# Patient Record
Sex: Female | Born: 1950 | Race: White | Hispanic: No | Marital: Married | Smoking: Former smoker
Health system: Southern US, Community
[De-identification: ages and names within clinical notes are randomized; demographics above are authoritative.]

## PROBLEM LIST (undated history)

## (undated) DIAGNOSIS — K5790 Diverticulosis of intestine, part unspecified, without perforation or abscess without bleeding: Secondary | ICD-10-CM

## (undated) DIAGNOSIS — I4891 Unspecified atrial fibrillation: Secondary | ICD-10-CM

## (undated) DIAGNOSIS — Z01419 Encounter for gynecological examination (general) (routine) without abnormal findings: Secondary | ICD-10-CM

## (undated) DIAGNOSIS — I499 Cardiac arrhythmia, unspecified: Secondary | ICD-10-CM

## (undated) DIAGNOSIS — R351 Nocturia: Secondary | ICD-10-CM

## (undated) DIAGNOSIS — M8580 Other specified disorders of bone density and structure, unspecified site: Secondary | ICD-10-CM

## (undated) DIAGNOSIS — F32A Depression, unspecified: Secondary | ICD-10-CM

## (undated) DIAGNOSIS — F319 Bipolar disorder, unspecified: Secondary | ICD-10-CM

## (undated) DIAGNOSIS — M545 Other chronic pain: Secondary | ICD-10-CM

## (undated) DIAGNOSIS — F419 Anxiety disorder, unspecified: Secondary | ICD-10-CM

## (undated) DIAGNOSIS — Z9289 Personal history of other medical treatment: Secondary | ICD-10-CM

## (undated) DIAGNOSIS — C50919 Malignant neoplasm of unspecified site of unspecified female breast: Secondary | ICD-10-CM

## (undated) DIAGNOSIS — N952 Postmenopausal atrophic vaginitis: Secondary | ICD-10-CM

## (undated) DIAGNOSIS — N8110 Cystocele, unspecified: Secondary | ICD-10-CM

## (undated) DIAGNOSIS — I447 Left bundle-branch block, unspecified: Secondary | ICD-10-CM

## (undated) DIAGNOSIS — F4310 Post-traumatic stress disorder, unspecified: Secondary | ICD-10-CM

## (undated) DIAGNOSIS — G8929 Other chronic pain: Secondary | ICD-10-CM

## (undated) DIAGNOSIS — H269 Unspecified cataract: Secondary | ICD-10-CM

## (undated) DIAGNOSIS — M1990 Unspecified osteoarthritis, unspecified site: Secondary | ICD-10-CM

## (undated) HISTORY — DX: Postmenopausal atrophic vaginitis: N95.2

## (undated) HISTORY — DX: Nocturia: R35.1

## (undated) HISTORY — DX: Post-traumatic stress disorder, unspecified: F43.10

## (undated) HISTORY — DX: Other chronic pain: M54.50

## (undated) HISTORY — DX: Diverticulosis of intestine, part unspecified, without perforation or abscess without bleeding: K57.90

## (undated) HISTORY — DX: Left bundle-branch block, unspecified: I44.7

## (undated) HISTORY — DX: Other chronic pain: G89.29

## (undated) HISTORY — DX: Bipolar disorder, unspecified: F31.9

## (undated) HISTORY — DX: Unspecified osteoarthritis, unspecified site: M19.90

## (undated) HISTORY — DX: Other specified disorders of bone density and structure, unspecified site: M85.80

## (undated) HISTORY — DX: Cystocele, unspecified: N81.10

---
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
## 1978-05-29 HISTORY — PX: SPLENECTOMY: SUR1306

## 1978-06-14 HISTORY — PX: OTHER SURGICAL HISTORY: SHX169

## 1991-05-24 DIAGNOSIS — C50919 Malignant neoplasm of unspecified site of unspecified female breast: Secondary | ICD-10-CM

## 1991-05-24 HISTORY — DX: Malignant neoplasm of unspecified site of unspecified female breast: C50.919

## 1991-06-24 HISTORY — PX: SIMPLE MASTECTOMY: SHX2409

## 2003-09-21 HISTORY — PX: OTHER SURGICAL HISTORY: SHX169

## 2012-09-11 DIAGNOSIS — R351 Nocturia: Secondary | ICD-10-CM | POA: Insufficient documentation

## 2012-10-30 DIAGNOSIS — K5790 Diverticulosis of intestine, part unspecified, without perforation or abscess without bleeding: Secondary | ICD-10-CM | POA: Insufficient documentation

## 2012-10-30 DIAGNOSIS — M545 Low back pain, unspecified: Secondary | ICD-10-CM | POA: Insufficient documentation

## 2012-10-30 DIAGNOSIS — M7740 Metatarsalgia, unspecified foot: Secondary | ICD-10-CM | POA: Insufficient documentation

## 2012-11-05 DIAGNOSIS — N393 Stress incontinence (female) (male): Secondary | ICD-10-CM | POA: Insufficient documentation

## 2012-11-05 HISTORY — PX: CYSTOURETHROSCOPY: SHX476

## 2012-11-05 HISTORY — PX: OTHER SURGICAL HISTORY: SHX169

## 2015-10-21 LAB — HM HEPATITIS C SCREENING LAB: HM Hepatitis Screen: NEGATIVE

## 2015-12-16 DIAGNOSIS — Z853 Personal history of malignant neoplasm of breast: Secondary | ICD-10-CM | POA: Insufficient documentation

## 2017-02-19 DIAGNOSIS — Z01419 Encounter for gynecological examination (general) (routine) without abnormal findings: Secondary | ICD-10-CM

## 2017-02-19 HISTORY — DX: Encounter for gynecological examination (general) (routine) without abnormal findings: Z01.419

## 2017-04-17 HISTORY — PX: COLONOSCOPY: SHX174

## 2017-04-17 LAB — HM COLONOSCOPY

## 2017-07-21 LAB — HM DEXA SCAN

## 2017-11-20 LAB — HM PAP SMEAR

## 2018-01-03 DIAGNOSIS — Z1379 Encounter for other screening for genetic and chromosomal anomalies: Secondary | ICD-10-CM | POA: Insufficient documentation

## 2018-05-23 DIAGNOSIS — I499 Cardiac arrhythmia, unspecified: Secondary | ICD-10-CM

## 2018-05-23 HISTORY — PX: OTHER SURGICAL HISTORY: SHX169

## 2018-05-23 HISTORY — DX: Cardiac arrhythmia, unspecified: I49.9

## 2018-07-02 DIAGNOSIS — Z9289 Personal history of other medical treatment: Secondary | ICD-10-CM

## 2018-07-02 HISTORY — DX: Personal history of other medical treatment: Z92.89

## 2018-07-02 LAB — HM MAMMOGRAPHY: HM Mammogram: NORMAL (ref 0–4)

## 2018-07-10 LAB — LIPID PANEL
Cholesterol: 231 — AB (ref 0–200)
HDL: 106 — AB (ref 35–70)
LDL Cholesterol: 117
Triglycerides: 56 (ref 40–160)

## 2018-07-10 LAB — HEMOGLOBIN A1C: Hemoglobin A1C: 5.4

## 2019-03-05 ENCOUNTER — Encounter: Payer: Self-pay | Admitting: Nurse Practitioner

## 2019-03-05 ENCOUNTER — Ambulatory Visit: Payer: Medicare Other | Admitting: Nurse Practitioner

## 2019-03-05 ENCOUNTER — Other Ambulatory Visit: Payer: Self-pay

## 2019-03-05 VITALS — BP 118/70 | HR 81 | Temp 98.4°F | Ht 64.5 in | Wt 137.0 lb

## 2019-03-05 DIAGNOSIS — F419 Anxiety disorder, unspecified: Secondary | ICD-10-CM

## 2019-03-05 DIAGNOSIS — Z7189 Other specified counseling: Secondary | ICD-10-CM

## 2019-03-05 DIAGNOSIS — M810 Age-related osteoporosis without current pathological fracture: Secondary | ICD-10-CM | POA: Diagnosis not present
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
## 2019-03-05 NOTE — Patient Instructions (Signed)
To call office and make new patient appt if you decide you would like to be one of our patients

## 2019-03-15 ENCOUNTER — Other Ambulatory Visit: Payer: Self-pay

## 2019-03-15 ENCOUNTER — Encounter: Payer: Self-pay | Admitting: Emergency Medicine

## 2019-03-15 ENCOUNTER — Observation Stay
Admission: EM | Admit: 2019-03-15 | Discharge: 2019-03-16 | Disposition: A | Discharge status: Home / Self Care | Payer: Medicare Other | Attending: Internal Medicine | Admitting: Internal Medicine

## 2019-03-15 ENCOUNTER — Emergency Department: Payer: Medicare Other

## 2019-03-15 DIAGNOSIS — I447 Left bundle-branch block, unspecified: Secondary | ICD-10-CM | POA: Insufficient documentation

## 2019-03-15 DIAGNOSIS — I4891 Unspecified atrial fibrillation: Secondary | ICD-10-CM | POA: Diagnosis not present

## 2019-03-15 DIAGNOSIS — Z87891 Personal history of nicotine dependence: Secondary | ICD-10-CM | POA: Diagnosis not present

## 2019-03-15 DIAGNOSIS — F419 Anxiety disorder, unspecified: Secondary | ICD-10-CM | POA: Diagnosis not present

## 2019-03-15 DIAGNOSIS — Z79899 Other long term (current) drug therapy: Secondary | ICD-10-CM | POA: Insufficient documentation

## 2019-03-15 DIAGNOSIS — I493 Ventricular premature depolarization: Secondary | ICD-10-CM | POA: Diagnosis not present

## 2019-03-15 DIAGNOSIS — Z66 Do not resuscitate: Secondary | ICD-10-CM | POA: Insufficient documentation

## 2019-03-15 DIAGNOSIS — Z888 Allergy status to other drugs, medicaments and biological substances status: Secondary | ICD-10-CM | POA: Diagnosis not present

## 2019-03-15 DIAGNOSIS — M1990 Unspecified osteoarthritis, unspecified site: Secondary | ICD-10-CM | POA: Diagnosis not present

## 2019-03-15 DIAGNOSIS — M8580 Other specified disorders of bone density and structure, unspecified site: Secondary | ICD-10-CM | POA: Diagnosis not present

## 2019-03-15 DIAGNOSIS — Z853 Personal history of malignant neoplasm of breast: Secondary | ICD-10-CM | POA: Insufficient documentation

## 2019-03-15 DIAGNOSIS — Z20828 Contact with and (suspected) exposure to other viral communicable diseases: Secondary | ICD-10-CM | POA: Diagnosis not present

## 2019-03-15 DIAGNOSIS — R002 Palpitations: Secondary | ICD-10-CM | POA: Diagnosis present

## 2019-03-15 DIAGNOSIS — F319 Bipolar disorder, unspecified: Secondary | ICD-10-CM | POA: Insufficient documentation

## 2019-03-15 LAB — CBC WITH DIFFERENTIAL/PLATELET
Abs Immature Granulocytes: 0.01 10*3/uL (ref 0.00–0.07)
Basophils Absolute: 0.1 10*3/uL (ref 0.0–0.1)
Basophils Relative: 1 %
Eosinophils Absolute: 0.3 10*3/uL (ref 0.0–0.5)
Eosinophils Relative: 3 %
HCT: 42.1 % (ref 36.0–46.0)
Hemoglobin: 14.3 g/dL (ref 12.0–15.0)
Immature Granulocytes: 0 %
Lymphocytes Relative: 45 %
Lymphs Abs: 3.5 10*3/uL (ref 0.7–4.0)
MCH: 31.1 pg (ref 26.0–34.0)
MCHC: 34 g/dL (ref 30.0–36.0)
MCV: 91.5 fL (ref 80.0–100.0)
Monocytes Absolute: 1.3 10*3/uL — ABNORMAL HIGH (ref 0.1–1.0)
Monocytes Relative: 17 %
Neutro Abs: 2.6 10*3/uL (ref 1.7–7.7)
Neutrophils Relative %: 34 %
Platelets: 315 10*3/uL (ref 150–400)
RBC: 4.6 MIL/uL (ref 3.87–5.11)
RDW: 13.6 % (ref 11.5–15.5)
WBC: 7.8 10*3/uL (ref 4.0–10.5)
nRBC: 0 % (ref 0.0–0.2)

## 2019-03-15 LAB — URINALYSIS, COMPLETE (UACMP) WITH MICROSCOPIC
Bacteria, UA: NONE SEEN
Bilirubin Urine: NEGATIVE
Glucose, UA: NEGATIVE mg/dL
Hgb urine dipstick: NEGATIVE
Ketones, ur: NEGATIVE mg/dL
Leukocytes,Ua: NEGATIVE
Nitrite: NEGATIVE
Protein, ur: NEGATIVE mg/dL
Specific Gravity, Urine: 1.005 (ref 1.005–1.030)
pH: 6 (ref 5.0–8.0)

## 2019-03-15 LAB — COMPREHENSIVE METABOLIC PANEL
ALT: 27 U/L (ref 0–44)
AST: 29 U/L (ref 15–41)
Albumin: 4.2 g/dL (ref 3.5–5.0)
Alkaline Phosphatase: 56 U/L (ref 38–126)
Anion gap: 10 (ref 5–15)
BUN: 21 mg/dL (ref 8–23)
CO2: 27 mmol/L (ref 22–32)
Calcium: 9.9 mg/dL (ref 8.9–10.3)
Chloride: 100 mmol/L (ref 98–111)
Creatinine, Ser: 0.92 mg/dL (ref 0.44–1.00)
GFR calc Af Amer: >60 mL/min (ref >60)
GFR calc non Af Amer: >60 mL/min (ref >60)
Glucose, Bld: 101 mg/dL — ABNORMAL HIGH (ref 70–99)
Potassium: 3.6 mmol/L (ref 3.5–5.1)
Sodium: 137 mmol/L (ref 135–145)
Total Bilirubin: 0.7 mg/dL (ref 0.3–1.2)
Total Protein: 7 g/dL (ref 6.5–8.1)

## 2019-03-15 LAB — TROPONIN I (HIGH SENSITIVITY)
Troponin I (High Sensitivity): 102 ng/L (ref <18)
Troponin I (High Sensitivity): 117 ng/L (ref <18)

## 2019-03-15 LAB — BRAIN NATRIURETIC PEPTIDE: B Natriuretic Peptide: 76 pg/mL (ref 0.0–100.0)

## 2019-03-15 IMAGING — DX DG CHEST 1V PORT
1 series · 1 of 1 positions shown · non-contrast
Comparison: None.

CLINICAL DATA: New onset atrial fibrillation

EXAM:
PORTABLE CHEST 1 VIEW

[chest ap]
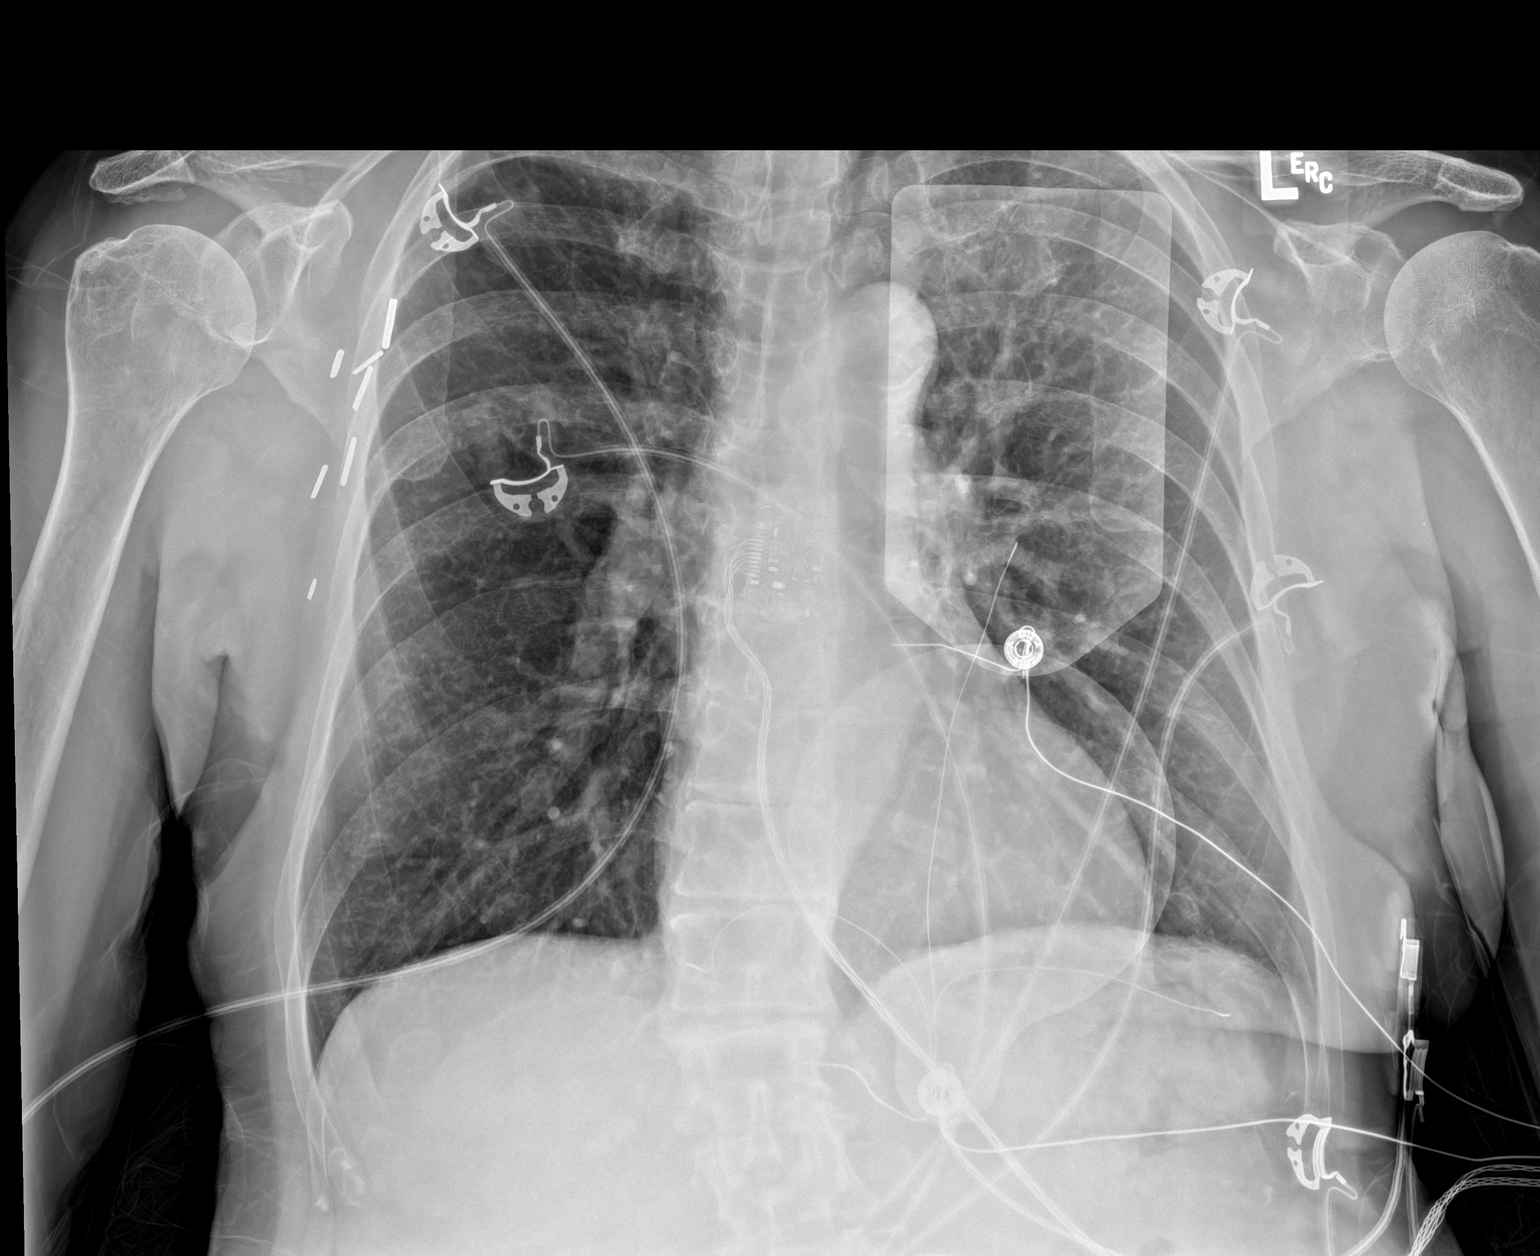

[1 of 1 positions shown; findings below may reference images not displayed]

FINDINGS: The heart size and mediastinal contours are within normal limits.
Both lungs are clear. The visualized skeletal structures are
unremarkable. Surgical clips about the right axilla.
IMPRESSION: No acute abnormality of the lungs in AP portable projection.

## 2019-03-15 MED ORDER — METOPROLOL TARTRATE 5 MG/5ML IV SOLN
5.0000 mg | Freq: Once | INTRAVENOUS | Status: AC
  Administered 2019-03-15 (×2): 5 mg via INTRAVENOUS

## 2019-03-15 MED ORDER — METOPROLOL TARTRATE 5 MG/5ML IV SOLN
5.0000 mg | Freq: Once | INTRAVENOUS | Status: AC
  Administered 2019-03-15: 5 mg via INTRAVENOUS

## 2019-03-15 MED ORDER — ASPIRIN 81 MG PO CHEW
324.0000 mg | CHEWABLE_TABLET | Freq: Once | ORAL | Status: AC
  Administered 2019-03-15: 324 mg via ORAL
  Filled 2019-03-15: qty 4

## 2019-03-15 MED ORDER — METOPROLOL TARTRATE 5 MG/5ML IV SOLN
INTRAVENOUS | Status: AC
  Administered 2019-03-15: 5 mg via INTRAVENOUS
  Filled 2019-03-15: qty 5

## 2019-03-15 MED ORDER — SODIUM CHLORIDE 0.9 % IV BOLUS
500.0000 mL | Freq: Once | INTRAVENOUS | Status: AC
  Administered 2019-03-15: 500 mL via INTRAVENOUS

## 2019-03-15 MED ORDER — METOPROLOL TARTRATE 5 MG/5ML IV SOLN
INTRAVENOUS | Status: AC
  Filled 2019-03-15: qty 5

## 2019-03-15 MED ORDER — METOPROLOL TARTRATE 25 MG PO TABS
25.0000 mg | ORAL_TABLET | Freq: Once | ORAL | Status: AC
  Administered 2019-03-15: 25 mg via ORAL
  Filled 2019-03-15: qty 1
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
## 2019-03-15 NOTE — ED Triage Notes (Signed)
Pt presents from [facility] via POV with c/o palpitations that have been ongoing for about 2 hours. Pt with abnormal EKG in triage and brought back immediately to room 8. MD Siadecki at bedside. Pt with non-sustained v-tach on monitor. Zoll pads placed on pt. Pt denies chest pain, shortness of breath, N/V. Pt alert and oriented x4.

## 2019-03-15 NOTE — ED Notes (Signed)
Pt taken meal tray and graham crackers

## 2019-03-15 NOTE — ED Notes (Signed)
Date and time results received: 03/15/19 1856 (use smartphrase ".now" to insert current time)  Test: troponin Critical Value: 102  Name of Provider Notified: Siadecki  Orders Received? Or Actions Taken?: Orders Received - See Orders for details

## 2019-03-15 NOTE — ED Notes (Signed)
Pt given ice cream

## 2019-03-16 DIAGNOSIS — I4891 Unspecified atrial fibrillation: Secondary | ICD-10-CM | POA: Diagnosis present

## 2019-03-16 HISTORY — DX: Unspecified atrial fibrillation: I48.91

## 2019-03-16 LAB — BASIC METABOLIC PANEL
Anion gap: 9 (ref 5–15)
BUN: 21 mg/dL (ref 8–23)
CO2: 25 mmol/L (ref 22–32)
Calcium: 8.8 mg/dL — ABNORMAL LOW (ref 8.9–10.3)
Chloride: 107 mmol/L (ref 98–111)
Creatinine, Ser: 0.73 mg/dL (ref 0.44–1.00)
GFR calc Af Amer: >60 mL/min (ref >60)
GFR calc non Af Amer: >60 mL/min (ref >60)
Glucose, Bld: 110 mg/dL — ABNORMAL HIGH (ref 70–99)
Potassium: 3.8 mmol/L (ref 3.5–5.1)
Sodium: 141 mmol/L (ref 135–145)

## 2019-03-16 LAB — LIPID PANEL
Cholesterol: 219 mg/dL — ABNORMAL HIGH (ref 0–200)
HDL: 81 mg/dL (ref >40)
LDL Cholesterol: 120 mg/dL — ABNORMAL HIGH (ref 0–99)
Total CHOL/HDL Ratio: 2.7 RATIO
Triglycerides: 92 mg/dL (ref <150)
VLDL: 18 mg/dL (ref 0–40)

## 2019-03-16 LAB — HIV ANTIBODY (ROUTINE TESTING W REFLEX): HIV Screen 4th Generation wRfx: NONREACTIVE

## 2019-03-16 LAB — SARS CORONAVIRUS 2 (TAT 6-24 HRS): SARS Coronavirus 2: NEGATIVE

## 2019-03-16 LAB — TSH: TSH: 1.868 u[IU]/mL (ref 0.350–4.500)

## 2019-03-16 LAB — T4, FREE: Free T4: 0.64 ng/dL (ref 0.61–1.12)

## 2019-03-16 MED ORDER — DULOXETINE HCL 30 MG PO CPEP
30.0000 mg | ORAL_CAPSULE | Freq: Every day | ORAL | Status: DC
  Filled 2019-03-16: qty 1

## 2019-03-16 MED ORDER — PROMETHAZINE HCL 25 MG/ML IJ SOLN: 12.5000 mg | Freq: Four times a day (QID) | INTRAMUSCULAR | Status: DC | PRN

## 2019-03-16 MED ORDER — APIXABAN 5 MG PO TABS: 5.0000 mg | ORAL_TABLET | Freq: Two times a day (BID) | ORAL | 0 refills | Status: DC

## 2019-03-16 MED ORDER — METOPROLOL TARTRATE 25 MG PO TABS: 25.0000 mg | ORAL_TABLET | Freq: Once | ORAL | Status: AC

## 2019-03-16 MED ORDER — ACETAMINOPHEN 325 MG PO TABS: 650.0000 mg | ORAL_TABLET | ORAL | Status: DC | PRN

## 2019-03-16 MED ORDER — METOPROLOL TARTRATE 25 MG PO TABS
25.0000 mg | ORAL_TABLET | Freq: Two times a day (BID) | ORAL | Status: DC
  Administered 2019-03-16 (×2): 25 mg via ORAL
  Filled 2019-03-16 (×2): qty 1

## 2019-03-16 MED ORDER — DULOXETINE HCL 30 MG PO CPEP
60.0000 mg | ORAL_CAPSULE | Freq: Every day | ORAL | Status: DC
  Filled 2019-03-16: qty 2

## 2019-03-16 MED ORDER — APIXABAN 5 MG PO TABS
5.0000 mg | ORAL_TABLET | Freq: Two times a day (BID) | ORAL | Status: DC
  Administered 2019-03-16: 5 mg via ORAL
  Filled 2019-03-16: qty 1

## 2019-03-16 MED ORDER — METOPROLOL TARTRATE 50 MG PO TABS: 50.0000 mg | ORAL_TABLET | Freq: Two times a day (BID) | ORAL | Status: DC

## 2019-03-16 MED ORDER — NAPROXEN SODIUM 220 MG PO TABS: 220.0000 mg | ORAL_TABLET | ORAL | Status: DC | PRN

## 2019-03-16 MED ORDER — BUSPIRONE HCL 15 MG PO TABS
15.0000 mg | ORAL_TABLET | Freq: Two times a day (BID) | ORAL | Status: DC
  Administered 2019-03-16: 15 mg via ORAL
  Filled 2019-03-16 (×3): qty 1

## 2019-03-16 MED ORDER — ONDANSETRON HCL 4 MG/2ML IJ SOLN: 4.0000 mg | Freq: Four times a day (QID) | INTRAMUSCULAR | Status: DC | PRN

## 2019-03-16 MED ORDER — SODIUM CHLORIDE 0.9 % IV SOLN
INTRAVENOUS | Status: DC
  Administered 2019-03-16: 02:00:00 via INTRAVENOUS

## 2019-03-16 MED ORDER — POLYETHYLENE GLYCOL 3350 17 G PO PACK
17.0000 g | PACK | Freq: Every day | ORAL | Status: DC
  Filled 2019-03-16: qty 1

## 2019-03-16 MED ORDER — METOPROLOL TARTRATE 25 MG/10 ML ORAL SUSPENSION: 25.0000 mg | Freq: Two times a day (BID) | ORAL | Status: DC

## 2019-03-16 MED ORDER — METOPROLOL TARTRATE 50 MG PO TABS: 50.0000 mg | ORAL_TABLET | Freq: Two times a day (BID) | ORAL | 0 refills | Status: DC

## 2019-03-16 MED ORDER — ENOXAPARIN SODIUM 40 MG/0.4ML ~~LOC~~ SOLN
40.0000 mg | SUBCUTANEOUS | Status: DC
  Administered 2019-03-16: 40 mg via SUBCUTANEOUS
  Filled 2019-03-16: qty 0.4

## 2019-03-16 MED ORDER — CALCIUM ACETATE (PHOS BINDER) 667 MG PO CAPS
667.0000 mg | ORAL_CAPSULE | Freq: Every day | ORAL | Status: DC
  Administered 2019-03-16: 667 mg via ORAL
  Filled 2019-03-16: qty 1

## 2019-03-16 NOTE — Care Management Obs Status (Signed)
[place] NOTIFICATION   Patient Details  Name: Ruth Klein MRN: [id number] Date of Birth: 03-Mar-1951   Medicare Observation Status Notification Given:  Yes    Khyleigh Furney A Amiaya Mcneeley, RN 03/16/2019, 3:47 PM

## 2019-03-16 NOTE — Care Management CC44 (Signed)
Condition Code 44 Documentation Completed  Patient Details  Name: Ruth Klein MRN: [id number] Date of Birth: December 22, 1950   Condition Code 44 given:  Yes Patient signature on Condition Code 44 notice:  Yes Documentation of 2 MD's agreement:  Yes Code 44 added to claim:  Yes    Latanya Maudlin, RN 03/16/2019, 3:47 PM
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
## 2019-03-16 NOTE — Discharge Instructions (Signed)
Resume diet and activity as before ° ° °
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
## 2019-03-16 NOTE — ED Notes (Signed)
First call report att, per Diandra receiving RN just got here phone number given for call back, made diandra aware of report note

## 2019-03-16 NOTE — ED Notes (Signed)
Pt made aware of room assignment, lights off, pt attempting to sleep in stretcher

## 2019-03-16 NOTE — Progress Notes (Signed)
Patient converted to NSR ~ 2pm. MD was notified.    RN removed IVs Patient left in a private vehicle with her husband.  Discharge teaching was done.  Patient and her husband understood and received paper Rx(s) for her medications.  Case Manager gave RN a coupon for the patient for her medication (blood thinner) and she was happy to receive it.   Phillis Knack, RN

## 2019-03-16 NOTE — Plan of Care (Signed)
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
## 2019-03-19 ENCOUNTER — Other Ambulatory Visit: Payer: Self-pay

## 2019-03-19 ENCOUNTER — Encounter: Payer: Self-pay | Admitting: Nurse Practitioner

## 2019-03-19 ENCOUNTER — Ambulatory Visit: Payer: Medicare Other | Admitting: Nurse Practitioner

## 2019-03-19 VITALS — BP 122/80 | HR 50 | Temp 98.1°F | Ht 64.0 in | Wt 144.8 lb

## 2019-03-19 DIAGNOSIS — F419 Anxiety disorder, unspecified: Secondary | ICD-10-CM | POA: Diagnosis not present

## 2019-03-19 DIAGNOSIS — I480 Paroxysmal atrial fibrillation: Secondary | ICD-10-CM | POA: Diagnosis not present
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
## 2019-03-20 ENCOUNTER — Telehealth: Payer: Self-pay

## 2019-03-20 DIAGNOSIS — I447 Left bundle-branch block, unspecified: Secondary | ICD-10-CM | POA: Insufficient documentation

## 2019-03-20 DIAGNOSIS — R001 Bradycardia, unspecified: Secondary | ICD-10-CM | POA: Insufficient documentation

## 2019-03-20 NOTE — Telephone Encounter (Signed)
She will probably bruise more easily, if she would like me to take a look at it tomorrow she can make an appt at the [facility].

## 2019-03-20 NOTE — Telephone Encounter (Signed)
Patient called wanting to know what to do because she slammed her finger in door and is currently on blood thinners and would like you to tell her what she should do for her finger she said it looks like it's starting to turn colors under the nail  Please advise

## 2019-03-21 ENCOUNTER — Encounter: Payer: Self-pay | Admitting: Nurse Practitioner

## 2019-03-21 ENCOUNTER — Other Ambulatory Visit: Payer: Self-pay

## 2019-03-21 ENCOUNTER — Ambulatory Visit: Payer: Medicare Other | Admitting: Nurse Practitioner
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
## 2019-03-28 ENCOUNTER — Other Ambulatory Visit: Payer: Self-pay

## 2019-03-28 ENCOUNTER — Ambulatory Visit: Payer: Medicare Other | Admitting: Nurse Practitioner

## 2019-03-28 ENCOUNTER — Encounter: Payer: Self-pay | Admitting: Nurse Practitioner

## 2019-03-28 VITALS — BP 110/72 | HR 56 | Temp 98.4°F | Ht 64.0 in | Wt 144.8 lb

## 2019-03-28 DIAGNOSIS — I480 Paroxysmal atrial fibrillation: Secondary | ICD-10-CM | POA: Diagnosis not present

## 2019-03-28 DIAGNOSIS — I208 Other forms of angina pectoris: Secondary | ICD-10-CM

## 2019-03-28 DIAGNOSIS — F419 Anxiety disorder, unspecified: Secondary | ICD-10-CM

## 2019-03-28 MED ORDER — METOPROLOL TARTRATE 25 MG PO TABS: 12.5000 mg | ORAL_TABLET | Freq: Two times a day (BID) | ORAL | 1 refills | Status: DC
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
## 2019-04-02 ENCOUNTER — Encounter: Payer: Self-pay | Admitting: Nurse Practitioner

## 2019-04-02 ENCOUNTER — Other Ambulatory Visit: Payer: Self-pay

## 2019-04-02 ENCOUNTER — Ambulatory Visit: Payer: Medicare Other | Admitting: Nurse Practitioner

## 2019-04-02 VITALS — BP 118/72 | HR 67 | Temp 99.1°F | Ht 64.0 in | Wt 144.8 lb

## 2019-04-02 DIAGNOSIS — I480 Paroxysmal atrial fibrillation: Secondary | ICD-10-CM

## 2019-04-02 DIAGNOSIS — N952 Postmenopausal atrophic vaginitis: Secondary | ICD-10-CM

## 2019-04-02 DIAGNOSIS — M8580 Other specified disorders of bone density and structure, unspecified site: Secondary | ICD-10-CM | POA: Diagnosis not present

## 2019-04-02 DIAGNOSIS — F419 Anxiety disorder, unspecified: Secondary | ICD-10-CM | POA: Insufficient documentation

## 2019-04-02 DIAGNOSIS — K5904 Chronic idiopathic constipation: Secondary | ICD-10-CM | POA: Insufficient documentation

## 2019-04-02 DIAGNOSIS — I208 Other forms of angina pectoris: Secondary | ICD-10-CM

## 2019-04-02 DIAGNOSIS — E782 Mixed hyperlipidemia: Secondary | ICD-10-CM | POA: Diagnosis not present

## 2019-04-02 DIAGNOSIS — M1990 Unspecified osteoarthritis, unspecified site: Secondary | ICD-10-CM
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
## 2019-04-05 ENCOUNTER — Telehealth: Payer: Self-pay

## 2019-04-05 NOTE — Telephone Encounter (Signed)
Ruth Mustache, NP asked me to call the patient and let her know that we received her DEXA results, which showed osteopenia.  Treatment was calcium with vitamin D and WBAT.  The patient stated she had another scan scheduled for March 2021.

## 2019-04-22 ENCOUNTER — Ambulatory Visit (INDEPENDENT_AMBULATORY_CARE_PROVIDER_SITE_OTHER): Payer: Medicare Other | Admitting: Family

## 2019-04-22 ENCOUNTER — Other Ambulatory Visit: Payer: Self-pay

## 2019-04-22 ENCOUNTER — Telehealth: Payer: Self-pay

## 2019-04-22 ENCOUNTER — Encounter: Payer: Self-pay | Admitting: Family

## 2019-04-22 DIAGNOSIS — J3489 Other specified disorders of nose and nasal sinuses: Secondary | ICD-10-CM

## 2019-04-22 DIAGNOSIS — Z20822 Contact with and (suspected) exposure to covid-19: Secondary | ICD-10-CM

## 2019-04-22 DIAGNOSIS — K5904 Chronic idiopathic constipation: Secondary | ICD-10-CM | POA: Diagnosis not present

## 2019-04-22 DIAGNOSIS — R05 Cough: Secondary | ICD-10-CM

## 2019-04-22 DIAGNOSIS — J029 Acute pharyngitis, unspecified: Secondary | ICD-10-CM

## 2019-04-22 DIAGNOSIS — R059 Cough, unspecified: Secondary | ICD-10-CM
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
## 2019-04-22 NOTE — Telephone Encounter (Signed)
Patient called to request a referral to be tested for Covid because she is going out of town made a Tele visit for her with Webb Silversmith to discuss her request

## 2019-04-22 NOTE — Patient Instructions (Signed)
This information is directly available on the CDC website: https://www.cdc.gov/coronavirus/2019-ncov/if-you-are-sick/steps-when-sick.html    Source:CDC Reference to specific commercial products, manufacturers, companies, or trademarks does not constitute its endorsement or recommendation by the U.S. Government, Department of Health and Human Services, or Centers for Disease Control and Prevention.  

## 2019-04-24 LAB — NOVEL CORONAVIRUS, NAA: SARS-CoV-2, NAA: NOT DETECTED

## 2019-06-18 ENCOUNTER — Ambulatory Visit: Payer: Medicare Other | Admitting: Nurse Practitioner

## 2019-06-18 ENCOUNTER — Telehealth: Payer: Self-pay

## 2019-06-18 ENCOUNTER — Encounter: Payer: Self-pay | Admitting: Nurse Practitioner

## 2019-06-18 ENCOUNTER — Other Ambulatory Visit: Payer: Self-pay

## 2019-06-18 VITALS — BP 130/92 | HR 92 | Temp 97.7°F | Resp 16 | Ht 64.0 in | Wt 145.0 lb

## 2019-06-18 DIAGNOSIS — R300 Dysuria: Secondary | ICD-10-CM | POA: Diagnosis not present
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
## 2019-06-18 NOTE — Telephone Encounter (Signed)
Ms. Kollmeyer would like to see if she can bring a urine sample by she thinks she may have a UTI her number is [phone]

## 2019-06-20 ENCOUNTER — Telehealth: Payer: Self-pay | Admitting: *Deleted

## 2019-06-20 DIAGNOSIS — R300 Dysuria: Secondary | ICD-10-CM

## 2019-06-20 MED ORDER — CIPROFLOXACIN HCL 250 MG PO TABS: 250.0000 mg | ORAL_TABLET | Freq: Two times a day (BID) | ORAL | 0 refills | Status: DC

## 2019-06-20 NOTE — Telephone Encounter (Signed)
Called the lab to inquire about the urine sample and it was not placed in the right collection cup. Pt called back to recollect- Ruth Klein reports blood to urine with sediment. Encouraged to increase hydration and decrease coffee/caffeine  (worsening of symptoms when Ruth Klein drinks coffee)  rx send it for Cipro while awaiting culture due to blood in urine with ongoing dysuria. To also start probiotic twice daily while on antibiotic

## 2019-06-20 NOTE — Telephone Encounter (Signed)
She showed up to the Office and spoke with Mrs.Eubanks.

## 2019-06-20 NOTE — Telephone Encounter (Signed)
Patient stated that she was seen on Tuesday at [facility] for possible UTI and stated a Urine test was done.  Stated that this morning when she urinated there was blood. Stated that she wants to speak with Janett Billow. Please Advise.

## 2019-06-25 ENCOUNTER — Ambulatory Visit: Payer: Medicare Other | Admitting: Nurse Practitioner

## 2019-06-25 ENCOUNTER — Ambulatory Visit: Payer: Self-pay | Admitting: Nurse Practitioner

## 2019-06-25 ENCOUNTER — Other Ambulatory Visit: Payer: Self-pay

## 2019-06-25 VITALS — BP 120/90 | HR 92 | Temp 98.1°F | Resp 16 | Ht 64.0 in | Wt 146.0 lb

## 2019-06-25 DIAGNOSIS — M7061 Trochanteric bursitis, right hip: Secondary | ICD-10-CM

## 2019-06-25 DIAGNOSIS — N3001 Acute cystitis with hematuria: Secondary | ICD-10-CM
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
## 2019-06-25 NOTE — Patient Instructions (Signed)
Aleve 1 tablet 220 mg by mouth twice daily for a min of 5 days, if needed take 7 days then stop.  Physical therapy   Hip Bursitis  Hip bursitis is inflammation of a fluid-filled sac (bursa) in the hip joint. The bursa prevents the bones in the hip joint from rubbing against each other. Hip bursitis can cause mild to moderate pain, and symptoms often come and go over time. What are the causes? This condition may be caused by:  Injury to the hip.  Overuse of the muscles that surround the hip joint.  Previous injury or surgery of the hip.  Arthritis or gout.  Diabetes.  Thyroid disease.  Infection. In some cases, the cause may not be known. What are the signs or symptoms? Symptoms of this condition include:  Mild or moderate pain in the hip area. Pain may get worse with movement.  Tenderness and swelling of the hip, especially on the outer side of the hip.  In rare cases, the bursa may become infected. This may cause a fever, as well as warmth and redness in the area. Symptoms may come and go. How is this diagnosed? This condition may be diagnosed based on:  A physical exam.  Your medical history.  X-rays.  Removal of fluid from your inflamed bursa for testing (biopsy). You may be sent to a health care provider who specializes in bone diseases (orthopedist) or a provider who specializes in joint inflammation (rheumatologist). How is this treated? This condition is treated by resting, icing, applying pressure (compression), and raising (elevating) the injured area. This is called RICE treatment. In some cases, this may be enough to make your symptoms go away. Treatment may also include:  Using crutches.  Draining fluid out of the bursa to help relieve swelling.  Injecting medicine that helps to reduce inflammation (cortisone).  Additional medicines if the bursa is infected. Follow these instructions at home: Managing pain, stiffness, and swelling   If directed,  put ice on the painful area. ? Put ice in a plastic bag. ? Place a towel between your skin and the bag. ? Leave the ice on for 20 minutes, 2-3 times a day. ? Raise (elevate) your hip above the level of your heart as much as you can without pain. To do this, try putting a pillow under your hips while you lie down. Activity  Return to your normal activities as told by your health care provider. Ask your health care provider what activities are safe for you.  Rest and protect your hip as much as possible until your pain and swelling get better. General instructions  Take over-the-counter and prescription medicines only as told by your health care provider.  Wear compression wraps only as told by your health care provider.  Do not use your hip to support your body weight until your health care provider says that you can. Use crutches as told by your health care provider.  Gently massage and stretch your injured area as often as is comfortable.  Keep all follow-up visits as told by your health care provider. This is important. How is this prevented?  Exercise regularly, as told by your health care provider.  Warm up and stretch before being active.  Cool down and stretch after being active.  If an activity irritates your hip or causes pain, avoid the activity as much as possible.  Avoid sitting down for long periods at a time. Contact a health care provider if you:  Have a  fever.  Develop new symptoms.  Have difficulty walking or doing everyday activities.  Have pain that gets worse or does not get better with medicine.  Develop red skin or a feeling of warmth in your hip area. Get help right away if you:  Cannot move your hip.  Have severe pain. Summary  Hip bursitis is inflammation of a fluid-filled sac (bursa) in the hip joint.  Hip bursitis can cause mild to moderate pain, and symptoms often come and go over time.  This condition is treated with rest, ice,  compression, elevation, and medicines. This information is not intended to replace advice given to you by your health care provider. Make sure you discuss any questions you have with your health care provider. Document Revised: 01/15/2018 Document Reviewed: 01/15/2018 Elsevier Patient Education  [employer].

## 2019-07-02 ENCOUNTER — Ambulatory Visit: Payer: Medicare Other | Admitting: Nurse Practitioner

## 2019-07-02 ENCOUNTER — Encounter: Payer: Self-pay | Admitting: Nurse Practitioner

## 2019-07-02 ENCOUNTER — Other Ambulatory Visit: Payer: Self-pay

## 2019-07-02 VITALS — BP 138/80 | HR 80 | Temp 98.2°F

## 2019-07-02 DIAGNOSIS — F419 Anxiety disorder, unspecified: Secondary | ICD-10-CM

## 2019-07-02 DIAGNOSIS — E2839 Other primary ovarian failure: Secondary | ICD-10-CM

## 2019-07-02 DIAGNOSIS — M8580 Other specified disorders of bone density and structure, unspecified site: Secondary | ICD-10-CM

## 2019-07-02 DIAGNOSIS — I208 Other forms of angina pectoris: Secondary | ICD-10-CM | POA: Diagnosis not present

## 2019-07-02 DIAGNOSIS — N952 Postmenopausal atrophic vaginitis: Secondary | ICD-10-CM

## 2019-07-02 DIAGNOSIS — N3001 Acute cystitis with hematuria: Secondary | ICD-10-CM

## 2019-07-02 DIAGNOSIS — I480 Paroxysmal atrial fibrillation: Secondary | ICD-10-CM

## 2019-07-02 DIAGNOSIS — Z853 Personal history of malignant neoplasm of breast: Secondary | ICD-10-CM

## 2019-07-02 DIAGNOSIS — E782 Mixed hyperlipidemia: Secondary | ICD-10-CM

## 2019-07-02 DIAGNOSIS — K5904 Chronic idiopathic constipation: Secondary | ICD-10-CM | POA: Diagnosis not present

## 2019-07-02 DIAGNOSIS — M7061 Trochanteric bursitis, right hip: Secondary | ICD-10-CM | POA: Diagnosis not present

## 2019-07-02 NOTE — Patient Instructions (Addendum)
If you do not hear back about referral in a week please call our office back and ask to speak with Lattie Haw (our referral coordinator) for an update.   Goal blood pressure LESS than 140/90  You can get a blood pressure cuff from pharmacy and janci ( [place]) at [facility] can help you use it if needed    DASH Eating Plan DASH stands for "Dietary Approaches to Stop Hypertension." The DASH eating plan is a healthy eating plan that has been shown to reduce high blood pressure (hypertension). It may also reduce your risk for type 2 diabetes, heart disease, and stroke. The DASH eating plan may also help with weight loss. What are tips for following this plan?  General guidelines  Avoid eating more than 2,300 mg (milligrams) of salt (sodium) a day. If you have hypertension, you may need to reduce your sodium intake to 1,500 mg a day.  Limit alcohol intake to no more than 1 drink a day for nonpregnant women and 2 drinks a day for men. One drink equals 12 oz of beer, 5 oz of wine, or 1 oz of hard liquor.  Work with your health care provider to maintain a healthy body weight or to lose weight. Ask what an ideal weight is for you.  Get at least 30 minutes of exercise that causes your heart to beat faster (aerobic exercise) most days of the week. Activities may include walking, swimming, or biking.  Work with your health care provider or diet and nutrition specialist (dietitian) to adjust your eating plan to your individual calorie needs. Reading food labels   Check food labels for the amount of sodium per serving. Choose foods with less than 5 percent of the Daily Value of sodium. Generally, foods with less than 300 mg of sodium per serving fit into this eating plan.  To find whole grains, look for the word "whole" as the first word in the ingredient list. Shopping  Buy products labeled as "low-sodium" or "no salt added."  Buy fresh foods. Avoid canned foods and premade or frozen  meals. Cooking  Avoid adding salt when cooking. Use salt-free seasonings or herbs instead of table salt or sea salt. Check with your health care provider or pharmacist before using salt substitutes.  Do not fry foods. Cook foods using healthy methods such as baking, boiling, grilling, and broiling instead.  Cook with heart-healthy oils, such as olive, canola, soybean, or sunflower oil. Meal planning  Eat a balanced diet that includes: ? 5 or more servings of fruits and vegetables each day. At each meal, try to fill half of your plate with fruits and vegetables. ? Up to 6-8 servings of whole grains each day. ? Less than 6 oz of lean meat, poultry, or fish each day. A 3-oz serving of meat is about the same size as a deck of cards. One egg equals 1 oz. ? 2 servings of low-fat dairy each day. ? A serving of nuts, seeds, or beans 5 times each week. ? Heart-healthy fats. Healthy fats called Omega-3 fatty acids are found in foods such as flaxseeds and coldwater fish, like sardines, salmon, and mackerel.  Limit how much you eat of the following: ? Canned or prepackaged foods. ? Food that is high in trans fat, such as fried foods. ? Food that is high in saturated fat, such as fatty meat. ? Sweets, desserts, sugary drinks, and other foods with added sugar. ? Full-fat dairy products.  Do not  salt foods before eating.  Try to eat at least 2 vegetarian meals each week.  Eat more home-cooked food and less restaurant, buffet, and fast food.  When eating at a restaurant, ask that your food be prepared with less salt or no salt, if possible. What foods are recommended? The items listed may not be a complete list. Talk with your dietitian about what dietary choices are best for you. Grains Whole-grain or whole-wheat bread. Whole-grain or whole-wheat pasta. Brown rice. [place]. Bulgur. Whole-grain and low-sodium cereals. Pita bread. Low-fat, low-sodium crackers. Whole-wheat flour  tortillas. Vegetables Fresh or frozen vegetables (raw, steamed, roasted, or grilled). Low-sodium or reduced-sodium tomato and vegetable juice. Low-sodium or reduced-sodium tomato sauce and tomato paste. Low-sodium or reduced-sodium canned vegetables. Fruits All fresh, dried, or frozen fruit. Canned fruit in natural juice (without added sugar). Meat and other protein foods Skinless chicken or [place]. Ground chicken or [place]. Pork with fat trimmed off. Fish and seafood. Egg whites. Dried beans, peas, or lentils. Unsalted nuts, nut butters, and seeds. Unsalted canned beans. Lean cuts of beef with fat trimmed off. Low-sodium, lean deli meat. Dairy Low-fat (1%) or fat-free (skim) milk. Fat-free, low-fat, or reduced-fat cheeses. Nonfat, low-sodium ricotta or cottage cheese. Low-fat or nonfat yogurt. Low-fat, low-sodium cheese. Fats and oils Soft margarine without trans fats. Vegetable oil. Low-fat, reduced-fat, or light mayonnaise and salad dressings (reduced-sodium). Canola, safflower, olive, soybean, and sunflower oils. Avocado. Seasoning and other foods Herbs. Spices. Seasoning mixes without salt. Unsalted popcorn and pretzels. Fat-free sweets. What foods are not recommended? The items listed may not be a complete list. Talk with your dietitian about what dietary choices are best for you. Grains Baked goods made with fat, such as croissants, muffins, or some breads. Dry pasta or rice meal packs. Vegetables Creamed or fried vegetables. Vegetables in a cheese sauce. Regular canned vegetables (not low-sodium or reduced-sodium). Regular canned tomato sauce and paste (not low-sodium or reduced-sodium). Regular tomato and vegetable juice (not low-sodium or reduced-sodium). Angie Fava. Olives. Fruits Canned fruit in a light or heavy syrup. Fried fruit. Fruit in cream or butter sauce. Meat and other protein foods Fatty cuts of meat. Ribs. Fried meat. Berniece Salines. Sausage. Bologna and other processed lunch meats.  Salami. Fatback. Hotdogs. Bratwurst. Salted nuts and seeds. Canned beans with added salt. Canned or smoked fish. Whole eggs or egg yolks. Chicken or [place] with skin. Dairy Whole or 2% milk, cream, and half-and-half. Whole or full-fat cream cheese. Whole-fat or sweetened yogurt. Full-fat cheese. Nondairy creamers. Whipped toppings. Processed cheese and cheese spreads. Fats and oils Butter. Stick margarine. Lard. Shortening. Ghee. Bacon fat. Tropical oils, such as coconut, palm kernel, or palm oil. Seasoning and other foods Salted popcorn and pretzels. Onion salt, garlic salt, seasoned salt, table salt, and sea salt. Worcestershire sauce. Tartar sauce. Barbecue sauce. Teriyaki sauce. Soy sauce, including reduced-sodium. Steak sauce. Canned and packaged gravies. Fish sauce. Oyster sauce. Cocktail sauce. Horseradish that you find on the shelf. Ketchup. Mustard. Meat flavorings and tenderizers. Bouillon cubes. Hot sauce and Tabasco sauce. Premade or packaged marinades. Premade or packaged taco seasonings. Relishes. Regular salad dressings. Where to find more information:  National Heart, Lung, and [place]: https://wilson-eaton.com/  American Heart Association: www.heart.org Summary  The DASH eating plan is a healthy eating plan that has been shown to reduce high blood pressure (hypertension). It may also reduce your risk for type 2 diabetes, heart disease, and stroke.  With the DASH eating plan, you should limit salt (sodium) intake to  2,300 mg a day. If you have hypertension, you may need to reduce your sodium intake to 1,500 mg a day.  When on the DASH eating plan, aim to eat more fresh fruits and vegetables, whole grains, lean proteins, low-fat dairy, and heart-healthy fats.  Work with your health care provider or diet and nutrition specialist (dietitian) to adjust your eating plan to your individual calorie needs. This information is not intended to replace advice given to you by your health  care provider. Make sure you discuss any questions you have with your health care provider. Document Revised: 04/21/2017 Document Reviewed: 05/02/2016 Elsevier Patient Education  2020 [employer].
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
## 2019-07-03 ENCOUNTER — Encounter: Payer: Self-pay | Admitting: Nurse Practitioner

## 2019-07-12 ENCOUNTER — Encounter: Payer: Self-pay | Admitting: Nurse Practitioner

## 2019-08-02 ENCOUNTER — Encounter: Payer: Self-pay | Admitting: Nurse Practitioner

## 2019-08-02 NOTE — Telephone Encounter (Signed)
Message forwarded to Lauree Chandler, NP

## 2019-08-19 ENCOUNTER — Encounter: Payer: Self-pay | Admitting: Nurse Practitioner

## 2019-08-20 ENCOUNTER — Encounter: Payer: Self-pay | Admitting: Nurse Practitioner

## 2019-08-20 ENCOUNTER — Other Ambulatory Visit: Payer: Self-pay

## 2019-08-20 ENCOUNTER — Ambulatory Visit: Payer: Medicare Other | Admitting: Nurse Practitioner

## 2019-08-20 VITALS — BP 138/90 | HR 84 | Temp 98.2°F | Resp 16 | Ht 64.0 in | Wt 144.0 lb

## 2019-08-20 DIAGNOSIS — N907 Vulvar cyst: Secondary | ICD-10-CM | POA: Diagnosis not present

## 2019-08-20 DIAGNOSIS — I208 Other forms of angina pectoris: Secondary | ICD-10-CM
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
## 2019-08-20 NOTE — Patient Instructions (Signed)
To use warm compress three times daily  GYN order placed

## 2019-08-26 ENCOUNTER — Other Ambulatory Visit: Payer: Self-pay

## 2019-08-26 ENCOUNTER — Ambulatory Visit
Admission: RE | Admit: 2019-08-26 | Discharge: 2019-08-26 | Disposition: A | Discharge status: Home / Self Care | Payer: Medicare Other | Source: Ambulatory Visit | Attending: Nurse Practitioner | Admitting: Nurse Practitioner

## 2019-08-26 DIAGNOSIS — E2839 Other primary ovarian failure: Secondary | ICD-10-CM

## 2019-08-26 DIAGNOSIS — M8580 Other specified disorders of bone density and structure, unspecified site: Secondary | ICD-10-CM

## 2019-10-01 ENCOUNTER — Encounter: Payer: Self-pay | Admitting: Nurse Practitioner

## 2019-10-01 ENCOUNTER — Ambulatory Visit: Payer: Medicare Other | Admitting: Nurse Practitioner

## 2019-10-01 ENCOUNTER — Other Ambulatory Visit: Payer: Self-pay

## 2019-10-01 VITALS — BP 120/80 | HR 81 | Temp 97.7°F | Ht 64.0 in | Wt 140.0 lb

## 2019-10-01 DIAGNOSIS — I208 Other forms of angina pectoris: Secondary | ICD-10-CM

## 2019-10-01 DIAGNOSIS — M25531 Pain in right wrist: Secondary | ICD-10-CM | POA: Diagnosis not present

## 2019-10-01 DIAGNOSIS — M8580 Other specified disorders of bone density and structure, unspecified site: Secondary | ICD-10-CM

## 2019-10-01 DIAGNOSIS — M25532 Pain in left wrist: Secondary | ICD-10-CM | POA: Diagnosis not present
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
## 2019-10-02 ENCOUNTER — Other Ambulatory Visit: Payer: Self-pay

## 2019-10-02 ENCOUNTER — Ambulatory Visit
Admission: RE | Admit: 2019-10-02 | Discharge: 2019-10-02 | Disposition: A | Discharge status: Home / Self Care | Payer: Medicare Other | Source: Ambulatory Visit | Attending: Nurse Practitioner | Admitting: Nurse Practitioner

## 2019-10-02 ENCOUNTER — Ambulatory Visit
Admission: RE | Admit: 2019-10-02 | Discharge: 2019-10-02 | Disposition: A | Discharge status: Home / Self Care | Payer: Medicare Other | Attending: Nurse Practitioner | Admitting: Nurse Practitioner

## 2019-10-02 DIAGNOSIS — M25532 Pain in left wrist: Secondary | ICD-10-CM | POA: Diagnosis present

## 2019-10-02 DIAGNOSIS — M8580 Other specified disorders of bone density and structure, unspecified site: Secondary | ICD-10-CM

## 2019-10-02 DIAGNOSIS — M25531 Pain in right wrist: Secondary | ICD-10-CM | POA: Diagnosis not present
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
## 2019-10-04 ENCOUNTER — Encounter: Payer: Self-pay | Admitting: Nurse Practitioner

## 2019-10-04 NOTE — Telephone Encounter (Signed)
Tried calling patient to schedule an appointment. Left message for her to return call.  Ruth Klein is it ok with you if we schedule with [name] next week if you have no availability.

## 2019-10-08 ENCOUNTER — Other Ambulatory Visit: Payer: Self-pay

## 2019-10-08 ENCOUNTER — Encounter: Payer: Self-pay | Admitting: Nurse Practitioner

## 2019-10-08 ENCOUNTER — Ambulatory Visit: Payer: Medicare Other | Admitting: Nurse Practitioner

## 2019-10-08 VITALS — BP 120/80 | HR 88 | Temp 97.5°F | Ht 64.0 in | Wt 136.5 lb

## 2019-10-08 DIAGNOSIS — M545 Low back pain, unspecified: Secondary | ICD-10-CM

## 2019-10-08 DIAGNOSIS — G8929 Other chronic pain: Secondary | ICD-10-CM

## 2019-10-08 DIAGNOSIS — M8580 Other specified disorders of bone density and structure, unspecified site: Secondary | ICD-10-CM | POA: Diagnosis not present

## 2019-10-08 DIAGNOSIS — R413 Other amnesia: Secondary | ICD-10-CM

## 2019-10-08 DIAGNOSIS — E782 Mixed hyperlipidemia: Secondary | ICD-10-CM | POA: Diagnosis not present

## 2019-10-08 DIAGNOSIS — I208 Other forms of angina pectoris: Secondary | ICD-10-CM

## 2019-10-08 DIAGNOSIS — S00511D Abrasion of lip, subsequent encounter: Secondary | ICD-10-CM
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
## 2019-10-09 ENCOUNTER — Other Ambulatory Visit: Payer: Self-pay | Admitting: Nurse Practitioner

## 2019-10-10 LAB — LIPID PANEL W/O CHOL/HDL RATIO
Cholesterol, Total: 190 mg/dL (ref 100–199)
HDL: 85 mg/dL (ref >39)
LDL Chol Calc (NIH): 96 mg/dL (ref 0–99)
Triglycerides: 46 mg/dL (ref 0–149)
VLDL Cholesterol Cal: 9 mg/dL (ref 5–40)

## 2019-10-10 LAB — CBC WITH DIFFERENTIAL/PLATELET
Basophils Absolute: 0.1 10*3/uL (ref 0.0–0.2)
Basos: 2 %
EOS (ABSOLUTE): 0.2 10*3/uL (ref 0.0–0.4)
Eos: 5 %
Hematocrit: 41.4 % (ref 34.0–46.6)
Hemoglobin: 13.7 g/dL (ref 11.1–15.9)
Immature Grans (Abs): 0 10*3/uL (ref 0.0–0.1)
Immature Granulocytes: 0 %
Lymphocytes Absolute: 1.9 10*3/uL (ref 0.7–3.1)
Lymphs: 47 %
MCH: 31.4 pg (ref 26.6–33.0)
MCHC: 33.1 g/dL (ref 31.5–35.7)
MCV: 95 fL (ref 79–97)
Monocytes Absolute: 0.8 10*3/uL (ref 0.1–0.9)
Monocytes: 19 %
Neutrophils Absolute: 1 10*3/uL — ABNORMAL LOW (ref 1.4–7.0)
Neutrophils: 27 %
Platelets: 298 10*3/uL (ref 150–450)
RBC: 4.36 x10E6/uL (ref 3.77–5.28)
RDW: 12.6 % (ref 11.7–15.4)
WBC: 3.9 10*3/uL (ref 3.4–10.8)

## 2019-10-10 LAB — COMPREHENSIVE METABOLIC PANEL
ALT: 20 IU/L (ref 0–32)
AST: 27 IU/L (ref 0–40)
Albumin/Globulin Ratio: 1.8 (ref 1.2–2.2)
Albumin: 4.4 g/dL (ref 3.8–4.8)
Alkaline Phosphatase: 61 IU/L (ref 48–121)
BUN/Creatinine Ratio: 21 (ref 12–28)
BUN: 16 mg/dL (ref 8–27)
Bilirubin Total: 0.5 mg/dL (ref 0.0–1.2)
CO2: 25 mmol/L (ref 20–29)
Calcium: 9.5 mg/dL (ref 8.7–10.3)
Chloride: 99 mmol/L (ref 96–106)
Creatinine, Ser: 0.75 mg/dL (ref 0.57–1.00)
GFR calc Af Amer: 93 mL/min/{1.73_m2} (ref >59)
GFR calc non Af Amer: 81 mL/min/{1.73_m2} (ref >59)
Globulin, Total: 2.5 g/dL (ref 1.5–4.5)
Glucose: 82 mg/dL (ref 65–99)
Potassium: 4.4 mmol/L (ref 3.5–5.2)
Sodium: 135 mmol/L (ref 134–144)
Total Protein: 6.9 g/dL (ref 6.0–8.5)

## 2019-10-10 LAB — VITAMIN B12: Vitamin B-12: >2000 pg/mL — ABNORMAL HIGH (ref 232–1245)

## 2019-10-10 LAB — FOLATE: Folate: >20 ng/mL (ref >3.0)

## 2019-10-16 ENCOUNTER — Encounter: Payer: Self-pay | Admitting: Nurse Practitioner

## 2019-10-22 NOTE — Telephone Encounter (Signed)
Reed, Tiffany L, DO  You 4 days ago   Please send this to Janett Billow when she returns since she knows this patient. Thanks.

## 2019-11-01 ENCOUNTER — Encounter: Payer: Self-pay | Admitting: Nurse Practitioner

## 2019-12-04 ENCOUNTER — Other Ambulatory Visit: Payer: Self-pay

## 2019-12-04 ENCOUNTER — Encounter: Payer: Self-pay | Admitting: Obstetrics and Gynecology

## 2019-12-04 ENCOUNTER — Ambulatory Visit (INDEPENDENT_AMBULATORY_CARE_PROVIDER_SITE_OTHER): Payer: Medicare Other | Admitting: Obstetrics and Gynecology

## 2019-12-04 VITALS — BP 120/74 | Ht 64.0 in | Wt 135.0 lb

## 2019-12-04 DIAGNOSIS — N907 Vulvar cyst: Secondary | ICD-10-CM | POA: Diagnosis not present

## 2019-12-04 MED ORDER — SULFAMETHOXAZOLE-TRIMETHOPRIM 800-160 MG PO TABS: 1.0000 | ORAL_TABLET | Freq: Two times a day (BID) | ORAL | 0 refills | Status: AC

## 2019-12-04 NOTE — Patient Instructions (Signed)
Try using warm compresses as able during the day and/or a warm bath in the evenings for a few weeks Take the Bactrim DS antibiotic twice daily for 5 days. Let us know if the cyst is not improving, or if it returns, and/or if symptoms continue, in which case we would want to consider excising the lesion in the office under local anesthesia

## 2019-12-04 NOTE — Progress Notes (Signed)
Zane Samson 02-27-1951 [id number]  SUBJECTIVE:  69 y.o. [id number] female new patient presents for evaluation of a left vulvar cyst.  She first noticed this in March and at the suggestion of her primary care provider she did warm compresses over the area and eventually the bump did go away.  She has noted that it has returned again and is slightly tender, today she even had a little bit of bleeding from the vaginal area which she thinks is from this lesion.  No drainage or fluctuance.  Does not recall any prior history of abnormal Pap smears or HPV infection.  Current Outpatient Medications  Medication Sig Dispense Refill  . Acetaminophen (TYLENOL PO) Take 500 mg by mouth as needed. Patient will confirm mg at next appointment.     . busPIRone (BUSPAR) 30 MG tablet Take 30 mg by mouth 2 (two) times daily.    . Calcium Carbonate (CALCIUM 600 PO) Take 1 tablet by mouth daily.    . DULoxetine (CYMBALTA) 30 MG capsule Take 30 mg by mouth daily. Takes with a 60 mg tablet    . DULoxetine (CYMBALTA) 60 MG capsule Take 60 mg by mouth daily. Takes with a 30 mg tablet    . NON FORMULARY Take by mouth daily as needed. Magnesium powder called "Calm" powder.    . polyethylene glycol (MIRALAX / GLYCOLAX) 17 g packet Take 17 g by mouth as needed.     . Turmeric (QC TUMERIC COMPLEX PO) Take by mouth in the morning, at noon, and at bedtime.    Marland Kitchen estradiol (ESTRACE) 0.1 MG/GM vaginal cream Place 1 Applicatorful vaginally. 1 application 2 times weekly (Patient not taking: Reported on 12/04/2019)    . Multiple Vitamins-Minerals (ONE-A-DAY 50 PLUS PO) Take by mouth. (Patient not taking: Reported on 12/04/2019)    . ondansetron (ZOFRAN) 4 MG tablet Take 4 mg by mouth as needed for nausea or vomiting. (Patient not taking: Reported on 12/04/2019)     No current facility-administered medications for this visit.   Allergies: Povidone-iodine  No LMP recorded. Patient is postmenopausal.  Past medical history,surgical  history, problem list, medications, allergies, family history and social history were all reviewed and documented as reviewed in the EPIC chart.  ROS:  Feeling well. No dyspnea or chest pain on exertion.  No abdominal pain, change in bowel habits, black or bloody stools.  No urinary tract symptoms. GYN ROS: As described in HPI   OBJECTIVE:  BP 120/74   Ht 5\' 4"  (1.626 m)   Wt 135 lb (61.2 kg)   BMI 23.17 kg/m  The patient appears well, alert, oriented x 3, in no distress.  PELVIC EXAM: VULVA: normal appearing vulva with no masses, tenderness, vulvar lesion at tip of left labia minora appears red and moist, palpates to be about size of a pea, mildly tender, no fluctuance, no purulence or surrounding erythema  Chaperone: Caryn Bee present during the examination  ASSESSMENT:  69 y.o. [id number] here for evaluation of vulvar folliculitis infecting a cyst - likely sebaceous in nature  PLAN:  By exam this appears to be a sebaceous cyst that has had infection with superimposed folliculitis as it is in a hairbearing area.  As there is no fluctuance, I do not think it be worthwhile to try I&D or needle drainage.  Instead I recommend using warm compresses as able during the day and warm bath in the evenings for a few weeks and will try Bactrim DS twice daily for 5 days.  If this does not heal the lesion, or does then the lesion returns, we may want to consider excising the lesion in the office under local anesthesia.  The patient is comfortable with this plan and will let us know if this plan is not working.   Joseph Pierini MD 12/04/19

## 2019-12-26 ENCOUNTER — Telehealth: Payer: Self-pay | Admitting: Family

## 2019-12-26 NOTE — Telephone Encounter (Signed)
Patient called on call provider at 21:44 pm states returning call to notify provider that she will not be keeping her appointment with Sherrie Mustache on 12/31/2019 states has changed practice.

## 2019-12-31 ENCOUNTER — Ambulatory Visit: Payer: Self-pay | Admitting: Nurse Practitioner

## 2020-04-15 ENCOUNTER — Other Ambulatory Visit: Payer: Medicare Other

## 2020-06-15 ENCOUNTER — Other Ambulatory Visit: Payer: Self-pay

## 2020-06-15 ENCOUNTER — Other Ambulatory Visit
Admission: RE | Admit: 2020-06-15 | Discharge: 2020-06-15 | Disposition: A | Discharge status: Home / Self Care | Payer: Medicare Other | Source: Ambulatory Visit | Attending: Internal Medicine | Admitting: Internal Medicine

## 2020-06-15 DIAGNOSIS — Z01812 Encounter for preprocedural laboratory examination: Secondary | ICD-10-CM | POA: Insufficient documentation

## 2020-06-15 DIAGNOSIS — Z20822 Contact with and (suspected) exposure to covid-19: Secondary | ICD-10-CM | POA: Insufficient documentation

## 2020-06-15 LAB — SARS CORONAVIRUS 2 (TAT 6-24 HRS): SARS Coronavirus 2: NEGATIVE

## 2020-06-16 ENCOUNTER — Encounter: Payer: Self-pay | Admitting: Internal Medicine

## 2020-06-17 ENCOUNTER — Encounter
Admission: RE | Disposition: A | Discharge status: Home / Self Care | Payer: Self-pay | Source: Home / Self Care | Attending: Internal Medicine

## 2020-06-17 ENCOUNTER — Encounter: Payer: Self-pay | Admitting: Internal Medicine

## 2020-06-17 ENCOUNTER — Ambulatory Visit: Payer: Medicare Other | Admitting: Anesthesiology

## 2020-06-17 ENCOUNTER — Ambulatory Visit
Admission: RE | Admit: 2020-06-17 | Discharge: 2020-06-17 | Disposition: A | Discharge status: Home / Self Care | Payer: Medicare Other | Attending: Internal Medicine | Admitting: Internal Medicine

## 2020-06-17 DIAGNOSIS — Z1211 Encounter for screening for malignant neoplasm of colon: Secondary | ICD-10-CM | POA: Insufficient documentation

## 2020-06-17 DIAGNOSIS — K640 First degree hemorrhoids: Secondary | ICD-10-CM | POA: Insufficient documentation

## 2020-06-17 DIAGNOSIS — Z79899 Other long term (current) drug therapy: Secondary | ICD-10-CM | POA: Insufficient documentation

## 2020-06-17 DIAGNOSIS — Z7901 Long term (current) use of anticoagulants: Secondary | ICD-10-CM | POA: Diagnosis not present

## 2020-06-17 DIAGNOSIS — Z87891 Personal history of nicotine dependence: Secondary | ICD-10-CM | POA: Diagnosis not present

## 2020-06-17 DIAGNOSIS — Z86010 Personal history of colonic polyps: Secondary | ICD-10-CM | POA: Diagnosis present

## 2020-06-17 DIAGNOSIS — Z888 Allergy status to other drugs, medicaments and biological substances status: Secondary | ICD-10-CM | POA: Insufficient documentation

## 2020-06-17 HISTORY — DX: Depression, unspecified: F32.A

## 2020-06-17 HISTORY — DX: Unspecified cataract: H26.9

## 2020-06-17 HISTORY — DX: Anxiety disorder, unspecified: F41.9

## 2020-06-17 HISTORY — PX: COLONOSCOPY WITH PROPOFOL: SHX5780

## 2020-06-17 SURGERY — COLONOSCOPY WITH PROPOFOL
Anesthesia: General

## 2020-06-17 MED ORDER — PROPOFOL 500 MG/50ML IV EMUL
INTRAVENOUS | Status: DC | PRN
  Administered 2020-06-17: 200 ug/kg/min via INTRAVENOUS

## 2020-06-17 MED ORDER — SODIUM CHLORIDE 0.9 % IV SOLN
INTRAVENOUS | Status: DC
  Administered 2020-06-17: 20 mL/h via INTRAVENOUS

## 2020-06-17 MED ORDER — PROPOFOL 10 MG/ML IV BOLUS
INTRAVENOUS | Status: DC | PRN
  Administered 2020-06-17: 50 mg via INTRAVENOUS

## 2020-06-17 NOTE — Transfer of Care (Signed)
Immediate Anesthesia Transfer of Care Note  Patient: Ruth Klein  Procedure(s) Performed: COLONOSCOPY WITH PROPOFOL (N/A )  Patient Location: PACU  Anesthesia Type:General  Level of Consciousness: awake, alert  and oriented  Airway & Oxygen Therapy: Patient Spontanous Breathing and Patient connected to nasal cannula oxygen  Post-op Assessment: Report given to RN and Post -op Vital signs reviewed and stable  Post vital signs: Reviewed and stable  Last Vitals:  Vitals Value Taken Time  BP 111/70 06/17/20 0933  Temp 36.4 C 06/17/20 0933  Pulse 73 06/17/20 0934  Resp 13 06/17/20 0934  SpO2 99 % 06/17/20 0934  Vitals shown include unvalidated device data.  Last Pain:  Vitals:   06/17/20 0933  TempSrc: Temporal  PainSc: 0-No pain         Complications: No complications documented.

## 2020-06-17 NOTE — Interval H&P Note (Signed)
History and Physical Interval Note:  06/17/2020 8:58 AM  Ruth Klein  has presented today for surgery, with the diagnosis of history of adenomatous colon polyps.  The various methods of treatment have been discussed with the patient and family. After consideration of risks, benefits and other options for treatment, the patient has consented to  Procedure(s): COLONOSCOPY WITH PROPOFOL (N/A) as a surgical intervention.  The patient's history has been reviewed, patient examined, no change in status, stable for surgery.  I have reviewed the patient's chart and labs.  Questions were answered to the patient's satisfaction.     [place], Laura

## 2020-06-17 NOTE — Op Note (Addendum)
[facility] Gastroenterology Patient Name: Ruth Klein Procedure Date: 06/17/2020 8:57 AM MRN: [id number] Account #: [id number] Date of Birth: [birth year] Admit Type: Outpatient Age: 70 Room: [facility] ENDO ROOM 2 Gender: Female Note Status: Supervisor Override Procedure:             Colonoscopy Indications:           Personal history of colonic polyps Providers:             Benay Pike.  MD, MD Medicines:             Propofol per Anesthesia Complications:         No immediate complications. Procedure:             Pre-Anesthesia Assessment:                        - The risks and benefits of the procedure and the                         sedation options and risks were discussed with the                         patient. All questions were answered and informed                         consent was obtained.                        - Patient identification and proposed procedure were                         verified prior to the procedure by the nurse. The                         procedure was verified in the procedure room.                        - ASA Grade Assessment: III - A patient with severe                         systemic disease.                        - After reviewing the risks and benefits, the patient                         was deemed in satisfactory condition to undergo the                         procedure.                        After obtaining informed consent, the colonoscope was                         passed under direct vision. Throughout the procedure,                         the patient's blood pressure, pulse, and oxygen  saturations were monitored continuously. The                         Colonoscope was introduced through the anus and                         advanced to the the cecum, identified by appendiceal                         orifice and ileocecal valve. The Colonoscope was                         introduced  through the anus and advanced to the. The                         colonoscopy was performed without difficulty. The                         patient tolerated the procedure well. The quality of                         the bowel preparation was good. Findings:      The perianal and digital rectal examinations were normal. Pertinent       negatives include normal sphincter tone and no palpable rectal lesions.      A tattoo was seen in the cecum. A post-polypectomy scar was found at the       tattoo site. There was no evidence of residual polyp tissue.      The exam was otherwise without abnormality.      Non-bleeding internal hemorrhoids were found during retroflexion. The       hemorrhoids were Grade I (internal hemorrhoids that do not prolapse).      The exam was otherwise without abnormality. Impression:            - A tattoo was seen in the cecum. A post-polypectomy                         scar was found at the tattoo site. There was no                         evidence of residual polyp tissue.                        - Non-bleeding internal hemorrhoids.                        - The examination was otherwise normal.                        - No specimens collected. Recommendation:        - Patient has a contact number available for                         emergencies. The signs and symptoms of potential                         delayed complications were discussed with the patient.  Return to normal activities tomorrow. Written                         discharge instructions were provided to the patient.                        - Resume previous diet.                        - Continue present medications.                        - Repeat colonoscopy in 5 years for surveillance.                        - Return to GI office PRN.                        - The findings and recommendations were discussed with                         the patient. Procedure Code(s):     ---  Professional ---                        [phone], Colonoscopy, flexible; diagnostic, including                         collection of specimen(s) by brushing or washing, when                         performed (separate procedure) Diagnosis Code(s):     --- Professional ---                        K64.0, First degree hemorrhoids CPT copyright 2019 American Medical Association. All rights reserved. The codes documented in this report are preliminary and upon coder review may  be revised to meet current compliance requirements. Efrain Sella MD, MD 06/17/2020 9:33:54 AM This report has been signed electronically. Number of Addenda: 0 Note Initiated On: 06/17/2020 8:57 AM Scope Withdrawal Time: 0 hours 9 minutes 5 seconds  Total Procedure Duration: 0 hours 17 minutes 6 seconds  Estimated Blood Loss:  Estimated blood loss: none.      [facility]

## 2020-06-17 NOTE — Anesthesia Postprocedure Evaluation (Signed)
Anesthesia Post Note  Patient: Ruth Klein  Procedure(s) Performed: COLONOSCOPY WITH PROPOFOL (N/A )  Patient location during evaluation: Endoscopy Anesthesia Type: General Level of consciousness: awake and alert and oriented Pain management: pain level controlled Vital Signs Assessment: post-procedure vital signs reviewed and stable Respiratory status: spontaneous breathing Cardiovascular status: blood pressure returned to baseline Anesthetic complications: no   No complications documented.   Last Vitals:  Vitals:   06/17/20 0953 06/17/20 1003  BP: 124/74 139/84  Pulse: 66 (!) 58  Resp: 17 15  Temp:    SpO2: 100% 100%    Last Pain:  Vitals:   06/17/20 1003  TempSrc:   PainSc: 0-No pain                 Kathrynne Kulinski
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
## 2020-06-17 NOTE — Anesthesia Procedure Notes (Signed)
Date/Time: 06/17/2020 9:15 AM Performed by: Nelda Marseille, CRNA Pre-anesthesia Checklist: Patient identified, Emergency Drugs available, Suction available, Patient being monitored and Timeout performed Oxygen Delivery Method: Nasal cannula
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
## 2020-06-18 ENCOUNTER — Encounter: Payer: Self-pay | Admitting: Internal Medicine

## 2020-08-10 ENCOUNTER — Other Ambulatory Visit: Payer: Self-pay

## 2020-08-10 ENCOUNTER — Ambulatory Visit: Payer: Medicare Other | Admitting: Dermatology

## 2020-08-10 DIAGNOSIS — Z1283 Encounter for screening for malignant neoplasm of skin: Secondary | ICD-10-CM | POA: Diagnosis not present

## 2020-08-10 DIAGNOSIS — D229 Melanocytic nevi, unspecified: Secondary | ICD-10-CM

## 2020-08-10 DIAGNOSIS — L814 Other melanin hyperpigmentation: Secondary | ICD-10-CM

## 2020-08-10 DIAGNOSIS — L820 Inflamed seborrheic keratosis: Secondary | ICD-10-CM | POA: Diagnosis not present

## 2020-08-10 DIAGNOSIS — L304 Erythema intertrigo: Secondary | ICD-10-CM

## 2020-08-10 DIAGNOSIS — L821 Other seborrheic keratosis: Secondary | ICD-10-CM

## 2020-08-10 DIAGNOSIS — D2262 Melanocytic nevi of left upper limb, including shoulder: Secondary | ICD-10-CM

## 2020-08-10 DIAGNOSIS — S00551A Superficial foreign body of lip, initial encounter: Secondary | ICD-10-CM | POA: Diagnosis not present

## 2020-08-10 DIAGNOSIS — D2361 Other benign neoplasm of skin of right upper limb, including shoulder: Secondary | ICD-10-CM

## 2020-08-10 DIAGNOSIS — T148XXA Other injury of unspecified body region, initial encounter: Secondary | ICD-10-CM

## 2020-08-10 DIAGNOSIS — D1800 Hemangioma unspecified site: Secondary | ICD-10-CM

## 2020-08-10 DIAGNOSIS — L578 Other skin changes due to chronic exposure to nonionizing radiation: Secondary | ICD-10-CM

## 2020-08-10 DIAGNOSIS — D239 Other benign neoplasm of skin, unspecified: Secondary | ICD-10-CM

## 2020-08-10 MED ORDER — KETOCONAZOLE 2 % EX CREA: 1.0000 "application " | TOPICAL_CREAM | CUTANEOUS | 4 refills | Status: DC

## 2020-08-10 NOTE — Progress Notes (Signed)
New Patient Visit  Subjective  Ruth Klein is a 70 y.o. female who presents for the following: Total body skin exam (No hx of skin ca, check spot L arm previous derm measured) and check spot (R upper Lip, pt had injury 1 yr ago).   The following portions of the chart were reviewed this encounter and updated as appropriate:       Review of Systems:  No other skin or systemic complaints except as noted in HPI or Assessment and Plan.  Objective  Well appearing patient in no apparent distress; mood and affect are within normal limits.  A full examination was performed including scalp, head, eyes, ears, nose, lips, neck, chest, axillae, abdomen, back, buttocks, bilateral upper extremities, bilateral lower extremities, hands, feet, fingers, toes, fingernails, and toenails. All findings within normal limits unless otherwise noted below.  Objective  R upper lip: 3.4mm blue gray firm sq nodule- h/o fall on ground hitting mouth  Objective  L arm, inframammary: Erythematous keratotic or waxy stuck-on papule    Objective    Left lower shoulder: 3.0 x 2.38mm med dark brown macule, no changes per pt  Images    Right Upper Arm - Anterior  Objective  Right Upper Arm - Anterior: 1.4mm dark blue macule  Images    Objective  Left Inframammary Fold: Small pink papules inframammary area   Assessment & Plan    Lentigines - Scattered tan macules - Due to sun exposure - Benign-appering, observe - Recommend daily broad spectrum sunscreen SPF 30+ to sun-exposed areas, reapply every 2 hours as needed. - Call for any changes  Seborrheic Keratoses - Stuck-on, waxy, tan-brown papules and plaques  - Discussed benign etiology and prognosis. - Observe - Call for any changes  Melanocytic Nevi - Tan-brown and/or pink-flesh-colored symmetric macules and papules - Benign appearing on exam today - Observation - Call clinic for new or changing moles - Recommend daily use of  broad spectrum spf 30+ sunscreen to sun-exposed areas.   Hemangiomas - Red papules - Discussed benign nature - Observe - Call for any changes  Actinic Damage - Chronic, secondary to cumulative UV/sun exposure - diffuse scaly erythematous macules with underlying dyspigmentation - Recommend daily broad spectrum sunscreen SPF 30+ to sun-exposed areas, reapply every 2 hours as needed.  - Call for new or changing lesions.  Skin cancer screening performed today.   Foreign body in skin R upper lip  Vs Cyst  Benign appearing, discussed excising vs observation, pt prefers to observe  Inflamed seborrheic keratosis L arm, inframammary  Benign, observe.   Pt declines txt today  Nevus Left lower shoulder  Benign-appearing.  Observation.  Call clinic for new or changing moles.  Recommend daily use of broad spectrum spf 30+ sunscreen to sun-exposed areas.    Blue nevus Right Upper Arm - Anterior  Benign-appearing.  Observation.  Call clinic for new or changing lesions.  Recommend daily use of broad spectrum spf 30+ sunscreen to sun-exposed areas.    Intertrigo Left Inframammary Fold  Intertrigo is a chronic recurrent rash that occurs in skin fold areas that may be associated with friction; heat; moisture; yeast; fungus; and bacteria.  It is exacerbated by increased movement / activity; sweating; and higher atmospheric temperature.   Start Ketoconazole 2% cr qd/bid aa rash until clear, then prn flares  ketoconazole (NIZORAL) 2 % cream - Left Inframammary Fold  Return in about 6 months (around 02/10/2021) for UBSE, recheck nevus L lower shouldr, Foreign body  R upper lip.   I, Othelia Pulling, RMA, am acting as scribe for Brendolyn Patty, MD .   Documentation: I have reviewed the above documentation for accuracy and completeness, and I agree with the above.  Brendolyn Patty MD

## 2020-08-10 NOTE — Patient Instructions (Signed)
Seborrheic Keratosis  What causes seborrheic keratoses? Seborrheic keratoses are harmless, common skin growths that first appear during adult life.  As time goes by, more growths appear.  Some people may develop a large number of them.  Seborrheic keratoses appear on both covered and uncovered body parts.  They are not caused by sunlight.  The tendency to develop seborrheic keratoses can be inherited.  They vary in color from skin-colored to gray, brown, or even black.  They can be either smooth or have a rough, warty surface.   Seborrheic keratoses are superficial and look as if they were stuck on the skin.  Under the microscope this type of keratosis looks like layers upon layers of skin.  That is why at times the top layer may seem to fall off, but the rest of the growth remains and re-grows.    Treatment Seborrheic keratoses do not need to be treated, but can easily be removed in the office.  Seborrheic keratoses often cause symptoms when they rub on clothing or jewelry.  Lesions can be in the way of shaving.  If they become inflamed, they can cause itching, soreness, or burning.  Removal of a seborrheic keratosis can be accomplished by freezing, burning, or surgery. If any spot bleeds, scabs, or grows rapidly, please return to have it checked, as these can be an indication of a skin cancer.   If you have any questions or concerns for your doctor, please call our main line at [phone] and press option 4 to reach your doctor's medical assistant. If no one answers, please leave a voicemail as directed and we will return your call as soon as possible. Messages left after 4 pm will be answered the following business day.   You may also send [place] a message via [place]. We typically respond to MyChart messages within 1-2 business days.  For prescription refills, please ask your pharmacy to contact our office. Our fax number is [phone].  If you have an urgent issue when the clinic is closed that  cannot wait until the next business day, you can page your doctor at the number below.    Please note that while we do our best to be available for urgent issues outside of office hours, we are not available 24/7.   If you have an urgent issue and are unable to reach [place], you may choose to seek medical care at your doctor's office, retail clinic, urgent care center, or emergency room.  If you have a medical emergency, please immediately call 911 or go to the emergency department.  Pager Numbers  - Dr. Nehemiah Massed: [phone]  - Dr. Laurence Ferrari: [phone]  - Dr. Nicole Kindred: [phone]  In the event of inclement weather, please call our main line at [phone] for an update on the status of any delays or closures.  Dermatology Medication Tips: Please keep the boxes that topical medications come in in order to help keep track of the instructions about where and how to use these. Pharmacies typically print the medication instructions only on the boxes and not directly on the medication tubes.   If your medication is too expensive, please contact our office at [phone] option 4 or send [place] a message through [place].   We are unable to tell what your co-pay for medications will be in advance as this is different depending on your insurance coverage. However, we may be able to find a substitute medication at lower cost or fill out paperwork to get insurance to  cover a needed medication.   If a prior authorization is required to get your medication covered by your insurance company, please allow [place] 1-2 business days to complete this process.  Drug prices often vary depending on where the prescription is filled and some pharmacies may offer cheaper prices.  The website www.goodrx.com contains coupons for medications through different pharmacies. The prices here do not account for what the cost may be with help from insurance (it may be cheaper with your insurance), but the website can give you the  price if you did not use any insurance.  - You can print the associated coupon and take it with your prescription to the pharmacy.  - You may also stop by our office during regular business hours and pick up a GoodRx coupon card.  - If you need your prescription sent electronically to a different pharmacy, notify our office through [facility] or by phone at [phone] option 4.

## 2020-09-30 ENCOUNTER — Ambulatory Visit: Payer: Medicare Other | Admitting: Dermatology

## 2020-10-07 ENCOUNTER — Other Ambulatory Visit: Payer: Self-pay

## 2020-10-07 ENCOUNTER — Ambulatory Visit: Payer: Medicare Other | Admitting: Dermatology

## 2020-10-07 DIAGNOSIS — L304 Erythema intertrigo: Secondary | ICD-10-CM

## 2020-10-07 DIAGNOSIS — L720 Epidermal cyst: Secondary | ICD-10-CM | POA: Diagnosis not present

## 2020-10-07 DIAGNOSIS — D225 Melanocytic nevi of trunk: Secondary | ICD-10-CM | POA: Diagnosis not present

## 2020-10-07 DIAGNOSIS — L820 Inflamed seborrheic keratosis: Secondary | ICD-10-CM

## 2020-10-07 DIAGNOSIS — D229 Melanocytic nevi, unspecified: Secondary | ICD-10-CM

## 2020-10-07 MED ORDER — KETOCONAZOLE 2 % EX CREA: 1.0000 "application " | TOPICAL_CREAM | Freq: Two times a day (BID) | CUTANEOUS | 2 refills | Status: AC

## 2020-10-07 NOTE — Progress Notes (Signed)
   Follow-Up Visit   Subjective  Ruth Klein is a 70 y.o. female who presents for the following: Skin Problem (Pt has what she believes to be skin tags on her chest that rub on her clothes and bother her that she would like to have removed. ).  They get so irritated that she can't wear a bra.  She uses ketoconazole cream under left breast which helps some.    The following portions of the chart were reviewed this encounter and updated as appropriate:      Review of Systems: No other skin or systemic complaints except as noted in HPI or Assessment and Plan.   Objective  Well appearing patient in no apparent distress; mood and affect are within normal limits.  A focused examination was performed including chest. Relevant physical exam findings are noted in the Assessment and Plan.  Objective  right lower chest x 17, left inframammary x 7 (24): Erythematous keratotic or waxy stuck-on papule  Objective  Right Abdomen (side) - Upper: 8 mm fleshy papule  Objective  Left Inframammary Fold: Mild erythema in fold  Objective  right upper lip: 3 mm firm blue, grey nodule- central right upper vermillion  Assessment & Plan  Inflamed seborrheic keratosis (24) right lower chest x 17, left inframammary x 7  Prior to procedure, discussed risks of blister formation, small wound, skin dyspigmentation, or rare scar following cryotherapy.    Destruction of lesion - right lower chest x 17, left inframammary x 7  Destruction method: cryotherapy   Destruction method comment:  Electrodessication Informed consent: discussed and consent obtained   Lesion destroyed using liquid nitrogen: Yes   Region frozen until ice ball extended beyond lesion: Yes   Outcome: patient tolerated procedure well with no complications   Post-procedure details: wound care instructions given    Nevus Right Abdomen (side) - Upper  Nevus vs skin tag.  Benign, observe. Discussed shave removal if  irritated  Intertrigo Left Inframammary Fold  Controlled Continue ketoconazole cream prn flares  Intertrigo is a chronic recurrent rash that occurs in skin fold areas that may be associated with friction; heat; moisture; yeast; fungus; and bacteria.  It is exacerbated by increased movement / activity; sweating; and higher atmospheric temperature.   ketoconazole (NIZORAL) 2 % cream - Left Inframammary Fold  Epidermal cyst right upper lip  Vs foreign body  Benign-appearing.  Observation.  Call clinic for new or changing lesions.   Return in about 8 weeks (around 12/02/2020) for 6-8 weeks recheck ISK.   I, Harriett Sine, CMA, am acting as scribe for Brendolyn Patty, MD.  Documentation: I have reviewed the above documentation for accuracy and completeness, and I agree with the above.  Brendolyn Patty MD

## 2020-10-07 NOTE — Patient Instructions (Signed)
Cryotherapy Aftercare  . Wash gently with soap and water everyday.   . Apply Vaseline and Band-Aid daily until healed.  

## 2020-12-01 ENCOUNTER — Ambulatory Visit: Payer: Medicare Other | Admitting: Dermatology

## 2020-12-01 ENCOUNTER — Other Ambulatory Visit: Payer: Self-pay

## 2020-12-01 DIAGNOSIS — L814 Other melanin hyperpigmentation: Secondary | ICD-10-CM

## 2020-12-01 DIAGNOSIS — L820 Inflamed seborrheic keratosis: Secondary | ICD-10-CM | POA: Diagnosis not present

## 2020-12-01 DIAGNOSIS — L304 Erythema intertrigo: Secondary | ICD-10-CM

## 2020-12-01 DIAGNOSIS — D1800 Hemangioma unspecified site: Secondary | ICD-10-CM

## 2020-12-01 DIAGNOSIS — L821 Other seborrheic keratosis: Secondary | ICD-10-CM

## 2020-12-01 MED ORDER — KETOCONAZOLE 2 % EX CREA: 1.0000 "application " | TOPICAL_CREAM | CUTANEOUS | 4 refills | Status: DC

## 2020-12-01 NOTE — Progress Notes (Signed)
   Follow-Up Visit   Subjective  Ruth Klein is a 70 y.o. female who presents for the following: Follow-up (Patient here today for 8 week follow up on isks at chest and rash at chest. She reports spots on chest have gotten better. ).   The following portions of the chart were reviewed this encounter and updated as appropriate:       Objective  Well appearing patient in no apparent distress; mood and affect are within normal limits.  A focused examination was performed including chest, right breast, left inframammary. Relevant physical exam findings are noted in the Assessment and Plan.  Left Inframammary Fold Mild erythema in fold  right lower chest , left inframammary Clear today at right lower chest and left inframammary  Assessment & Plan  Erythema intertrigo Left Inframammary Fold  With mild/mod flare Intertrigo is a chronic recurrent rash that occurs in skin fold areas that may be associated with friction; heat; moisture; yeast; fungus; and bacteria.  It is exacerbated by increased movement / activity; sweating; and higher atmospheric temperature.  Restart ketoconazole cream qd and bid prn flares  Intertrigo  Related Medications ketoconazole (NIZORAL) 2 % cream Apply 1 application topically as directed. Qd to bid aa itchy rash under breast until clear 2-4 week, then prn flares  Inflamed seborrheic keratosis right lower chest , left inframammary  Improved post cryotherapy  Seborrheic Keratoses - Stuck-on, waxy, tan-brown papules and/or plaques  - Benign-appearing - Discussed benign etiology and prognosis. - Observe - Call for any changes  Lentigines - Scattered tan macules - Due to sun exposure - Benign-appering, observe - Recommend daily broad spectrum sunscreen SPF 30+ to sun-exposed areas, reapply every 2 hours as needed. - Call for any changes  Hemangiomas - Red papules - Discussed benign nature - Observe - Call for any changes  Return for  keep appointment as schedule 9 / 13 / 22. I, Ruthell Rummage, CMA, am acting as scribe for Brendolyn Patty, MD.  Documentation: I have reviewed the above documentation for accuracy and completeness, and I agree with the above.  Brendolyn Patty MD

## 2020-12-01 NOTE — Patient Instructions (Addendum)
Seborrheic Keratosis  What causes seborrheic keratoses? Seborrheic keratoses are harmless, common skin growths that first appear during adult life.  As time goes by, more growths appear.  Some people may develop a large number of them.  Seborrheic keratoses appear on both covered and uncovered body parts.  They are not caused by sunlight.  The tendency to develop seborrheic keratoses can be inherited.  They vary in color from skin-colored to gray, brown, or even black.  They can be either smooth or have a rough, warty surface.   Seborrheic keratoses are superficial and look as if they were stuck on the skin.  Under the microscope this type of keratosis looks like layers upon layers of skin.  That is why at times the top layer may seem to fall off, but the rest of the growth remains and re-grows.    Treatment Seborrheic keratoses do not need to be treated, but can easily be removed in the office.  Seborrheic keratoses often cause symptoms when they rub on clothing or jewelry.  Lesions can be in the way of shaving.  If they become inflamed, they can cause itching, soreness, or burning.  Removal of a seborrheic keratosis can be accomplished by freezing, burning, or surgery. If any spot bleeds, scabs, or grows rapidly, please return to have it checked, as these can be an indication of a skin cancer.  If you have any questions or concerns for your doctor, please call our main line at [phone] and press option 4 to reach your doctor's medical assistant. If no one answers, please leave a voicemail as directed and we will return your call as soon as possible. Messages left after 4 pm will be answered the following business day.   You may also send [place] a message via [place]. We typically respond to MyChart messages within 1-2 business days.  For prescription refills, please ask your pharmacy to contact our office. Our fax number is [phone].  If you have an urgent issue when the clinic is closed that  cannot wait until the next business day, you can page your doctor at the number below.    Please note that while we do our best to be available for urgent issues outside of office hours, we are not available 24/7.   If you have an urgent issue and are unable to reach [place], you may choose to seek medical care at your doctor's office, retail clinic, urgent care center, or emergency room.  If you have a medical emergency, please immediately call 911 or go to the emergency department.  Pager Numbers  - Dr. Nehemiah Massed: [phone]  - Dr. Laurence Ferrari: [phone]  - Dr. Nicole Kindred: [phone]  In the event of inclement weather, please call our main line at [phone] for an update on the status of any delays or closures.  Dermatology Medication Tips: Please keep the boxes that topical medications come in in order to help keep track of the instructions about where and how to use these. Pharmacies typically print the medication instructions only on the boxes and not directly on the medication tubes.   If your medication is too expensive, please contact our office at [phone] option 4 or send [place] a message through [place].   We are unable to tell what your co-pay for medications will be in advance as this is different depending on your insurance coverage. However, we may be able to find a substitute medication at lower cost or fill out paperwork to get insurance to cover  a needed medication.   If a prior authorization is required to get your medication covered by your insurance company, please allow [place] 1-2 business days to complete this process.  Drug prices often vary depending on where the prescription is filled and some pharmacies may offer cheaper prices.  The website www.goodrx.com contains coupons for medications through different pharmacies. The prices here do not account for what the cost may be with help from insurance (it may be cheaper with your insurance), but the website can give you the  price if you did not use any insurance.  - You can print the associated coupon and take it with your prescription to the pharmacy.  - You may also stop by our office during regular business hours and pick up a GoodRx coupon card.  - If you need your prescription sent electronically to a different pharmacy, notify our office through [facility] or by phone at [phone] option 4.

## 2020-12-09 DIAGNOSIS — M4125 Other idiopathic scoliosis, thoracolumbar region: Secondary | ICD-10-CM | POA: Insufficient documentation

## 2020-12-09 DIAGNOSIS — M5136 Other intervertebral disc degeneration, lumbar region: Secondary | ICD-10-CM | POA: Insufficient documentation

## 2021-01-19 DIAGNOSIS — G4733 Obstructive sleep apnea (adult) (pediatric): Secondary | ICD-10-CM | POA: Insufficient documentation

## 2021-02-02 ENCOUNTER — Ambulatory Visit: Payer: Medicare Other | Admitting: Dermatology

## 2021-02-02 ENCOUNTER — Other Ambulatory Visit: Payer: Self-pay

## 2021-02-02 DIAGNOSIS — D239 Other benign neoplasm of skin, unspecified: Secondary | ICD-10-CM

## 2021-02-02 DIAGNOSIS — L820 Inflamed seborrheic keratosis: Secondary | ICD-10-CM | POA: Diagnosis not present

## 2021-02-02 DIAGNOSIS — L821 Other seborrheic keratosis: Secondary | ICD-10-CM

## 2021-02-02 DIAGNOSIS — T148XXA Other injury of unspecified body region, initial encounter: Secondary | ICD-10-CM

## 2021-02-02 DIAGNOSIS — S00551A Superficial foreign body of lip, initial encounter: Secondary | ICD-10-CM | POA: Diagnosis not present

## 2021-02-02 DIAGNOSIS — D229 Melanocytic nevi, unspecified: Secondary | ICD-10-CM

## 2021-02-02 DIAGNOSIS — D2362 Other benign neoplasm of skin of left upper limb, including shoulder: Secondary | ICD-10-CM | POA: Diagnosis not present

## 2021-02-02 DIAGNOSIS — L304 Erythema intertrigo: Secondary | ICD-10-CM

## 2021-02-02 DIAGNOSIS — Z1283 Encounter for screening for malignant neoplasm of skin: Secondary | ICD-10-CM | POA: Diagnosis not present

## 2021-02-02 DIAGNOSIS — L814 Other melanin hyperpigmentation: Secondary | ICD-10-CM

## 2021-02-02 DIAGNOSIS — D2262 Melanocytic nevi of left upper limb, including shoulder: Secondary | ICD-10-CM

## 2021-02-02 DIAGNOSIS — L719 Rosacea, unspecified: Secondary | ICD-10-CM

## 2021-02-02 DIAGNOSIS — L578 Other skin changes due to chronic exposure to nonionizing radiation: Secondary | ICD-10-CM

## 2021-02-02 MED ORDER — AZELAIC ACID 20 % EX CREA: TOPICAL_CREAM | CUTANEOUS | 5 refills | Status: DC

## 2021-02-02 NOTE — Patient Instructions (Addendum)
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
## 2021-02-17 ENCOUNTER — Telehealth: Payer: Self-pay

## 2021-02-17 DIAGNOSIS — L719 Rosacea, unspecified: Secondary | ICD-10-CM

## 2021-02-17 MED ORDER — METRONIDAZOLE 0.75 % EX CREA: TOPICAL_CREAM | CUTANEOUS | 3 refills | Status: DC

## 2021-02-17 NOTE — Addendum Note (Signed)
Addended by: Harriett Sine on: 02/17/2021 02:13 PM   Modules accepted: Orders

## 2021-02-17 NOTE — Telephone Encounter (Signed)
Azelex denied by insurance. Pt must try and fail 2 of the following -   Adapalene cream Clindamycin-benzoyl peroxide 1-5% Erythromycin-benzoyl peroxide Tazarotene cream Tretinoin cream

## 2021-03-09 ENCOUNTER — Other Ambulatory Visit: Payer: Self-pay | Admitting: Family Medicine

## 2021-03-09 DIAGNOSIS — M5416 Radiculopathy, lumbar region: Secondary | ICD-10-CM

## 2021-04-13 ENCOUNTER — Other Ambulatory Visit: Payer: Medicare Other

## 2021-04-23 ENCOUNTER — Ambulatory Visit
Admission: RE | Admit: 2021-04-23 | Discharge: 2021-04-23 | Disposition: A | Discharge status: Home / Self Care | Payer: Medicare Other | Source: Ambulatory Visit | Attending: Family Medicine | Admitting: Family Medicine

## 2021-04-23 DIAGNOSIS — M5416 Radiculopathy, lumbar region: Secondary | ICD-10-CM

## 2021-04-23 IMAGING — MR MR LUMBAR SPINE W/O CM
4 of 5 series · 18 of 48 positions shown · non-contrast
Comparison: None.

CLINICAL DATA: Right hip and leg pain for 6 months. History of
breast cancer

EXAM:
MRI LUMBAR SPINE WITHOUT CONTRAST
TECHNIQUE: Multiplanar, multisequence MR imaging of the lumbar spine was
performed. No intravenous contrast was administered.

[Series 6: T2 · sagittal · 4.0mm · 0.73mm/px · 6 of 17 slices shown (1 of 2)]
[im 1/17]
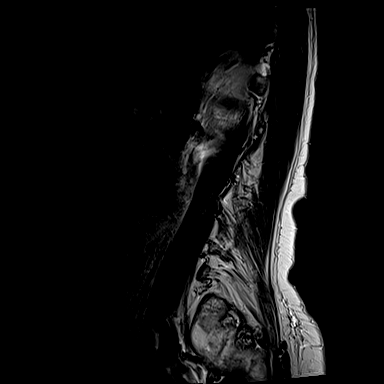
[im 4/17]
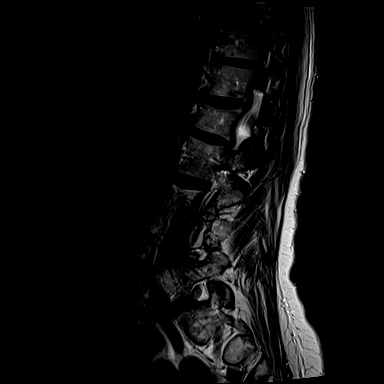
[im 7/17]
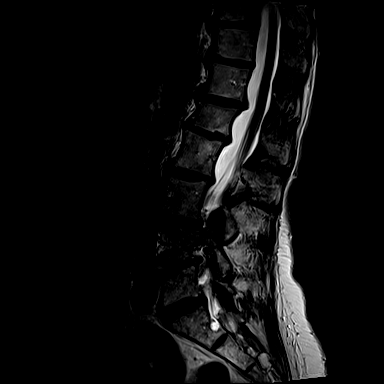
[im 10/17]
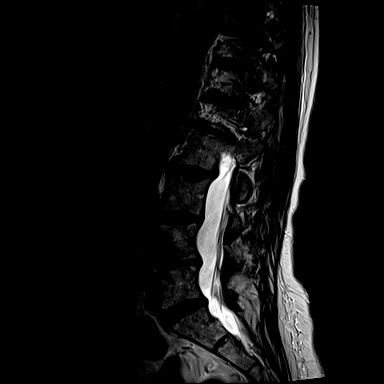
[im 13/17]
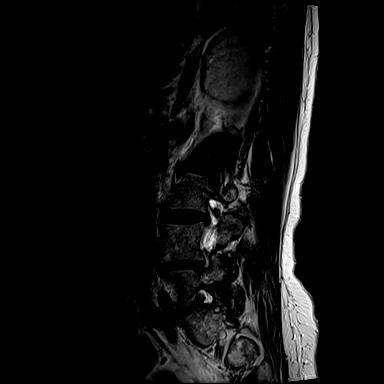
[im 17/17]
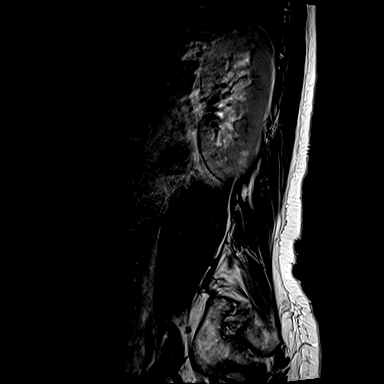

[Series 7: T1 · sagittal · 4.0mm · 0.73mm/px · 3 of 17 slices shown (1 of 2)]
[im 4/17]
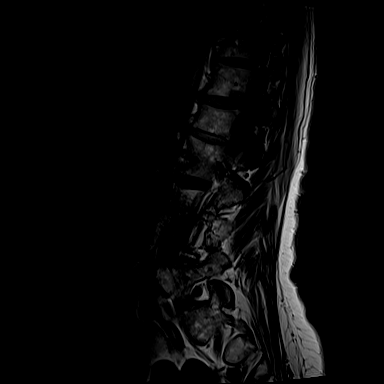
[im 10/17]
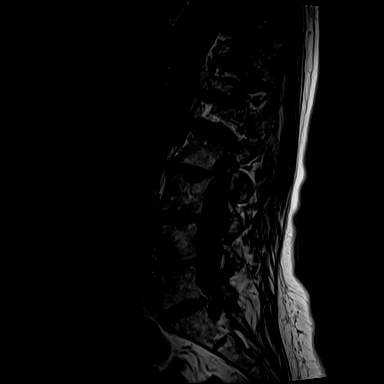
[im 17/17]
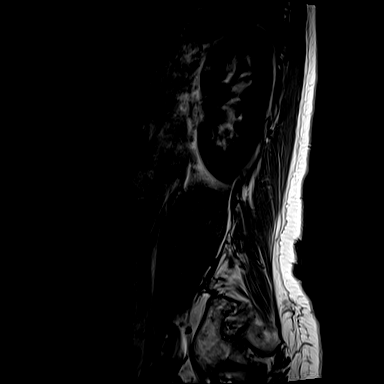

[Series 11: T1 · axial · 4.0mm · 0.28mm/px · z∈[+13,+166]mm · 3 of 39 slices shown (2 of 2)]
[im 6/39]
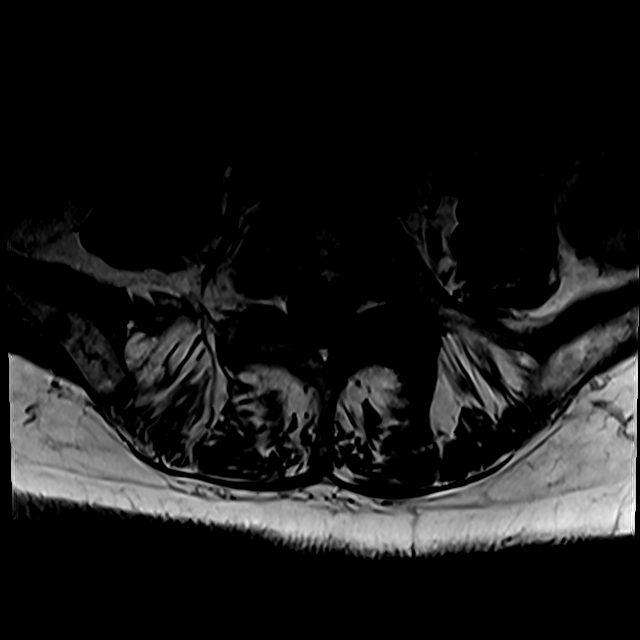
[im 20/39]
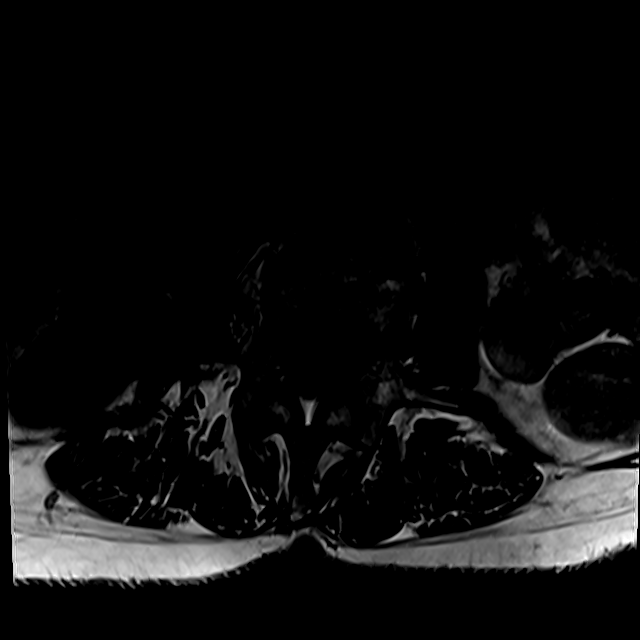
[im 33/39]
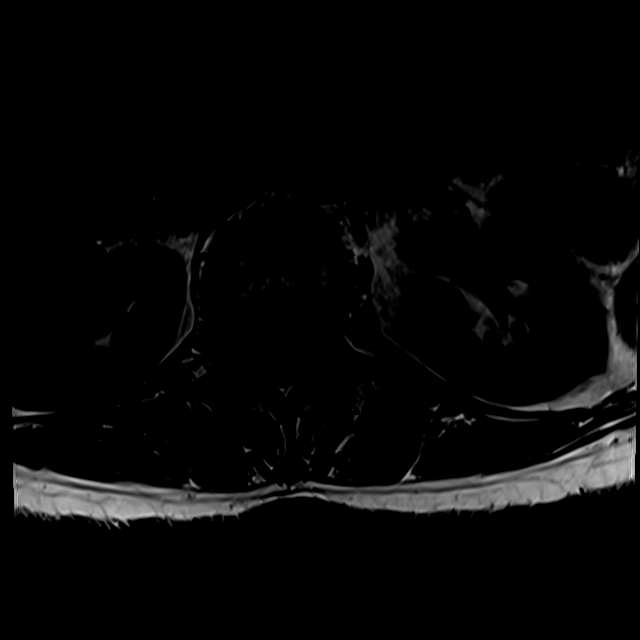

[Series 14: T2 · axial · 4.0mm · 0.28mm/px · z∈[-12,+166]mm · 6 of 39 slices shown (2 of 2)]
[im 1/39]
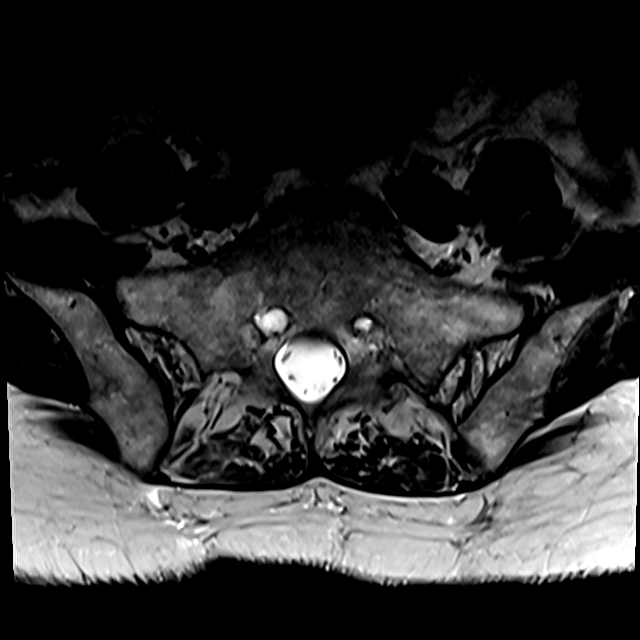
[im 6/39]
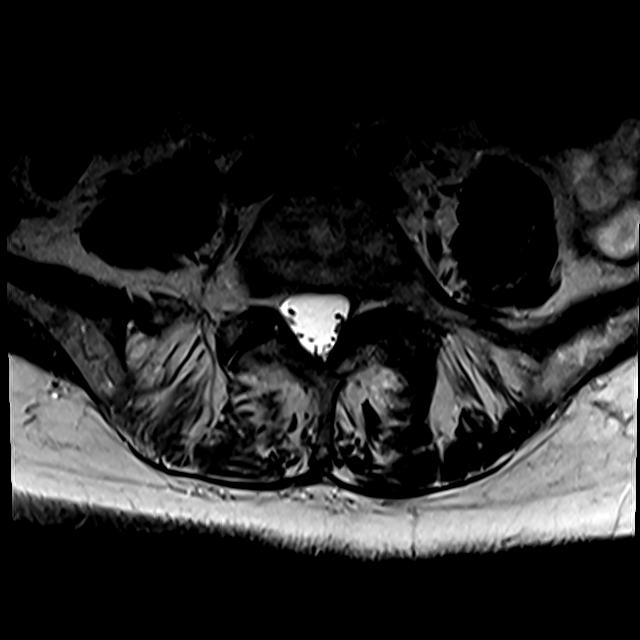
[im 11/39]
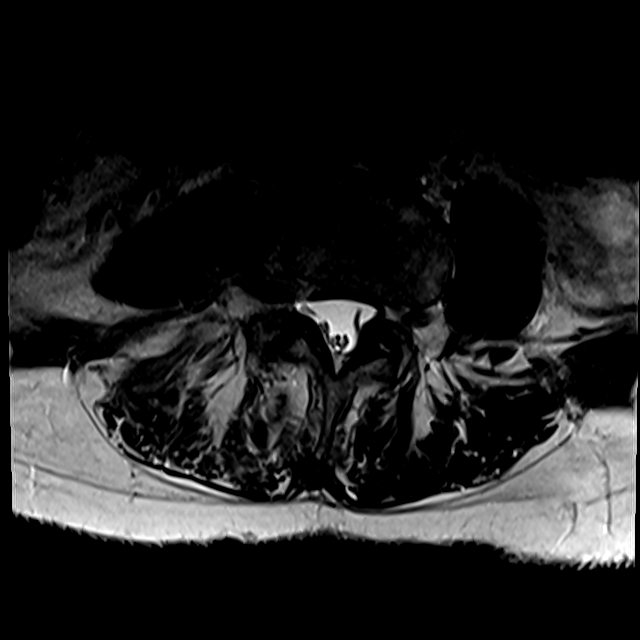
[im 17/39]
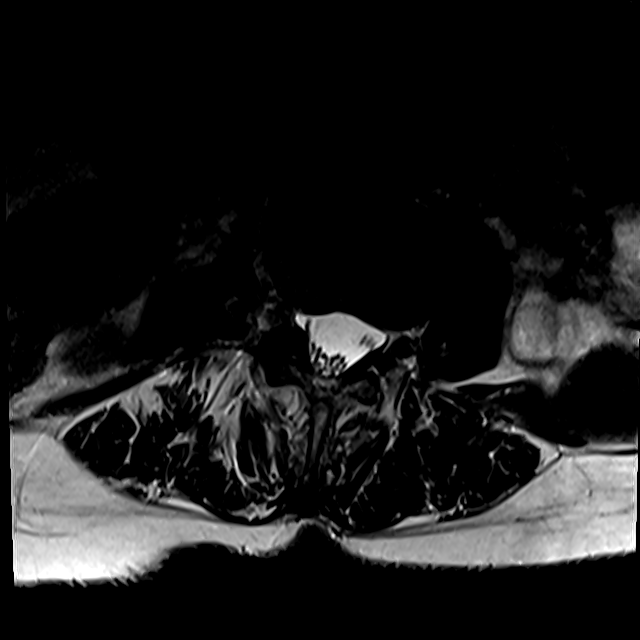
[im 20/39]
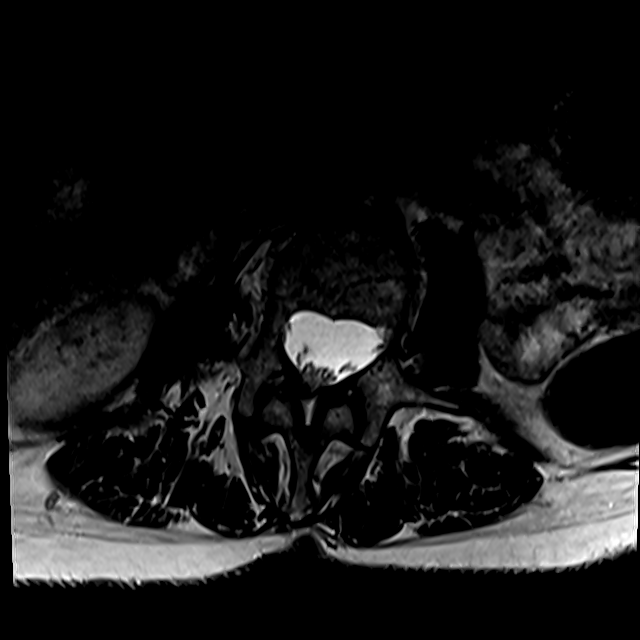
[im 33/39]
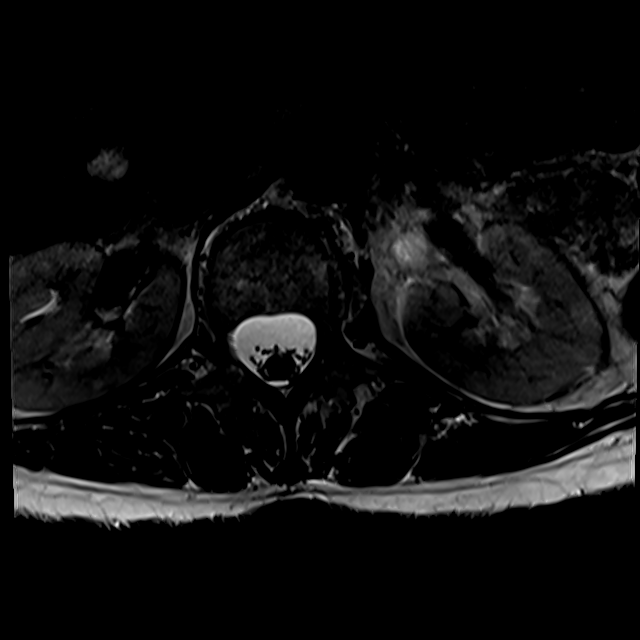

[18 of 48 positions shown; findings below may reference images not displayed]

FINDINGS: Segmentation:  5 lumbar type vertebrae

Alignment:  Levoscoliosis

Vertebrae: No acute fracture, evidence of discitis, or bone
lesion.Remote L1 fracture.

Conus medullaris and cauda equina: Conus extends to the L2 level.
Conus and cauda equina appear normal.

Paraspinal and other soft tissues: Negative for perispinal mass or
inflammation.

Disc levels:

T12- L1: Disc narrowing and bulging with left paracentral
protrusion.

L1-L2: Disc narrowing and bulging with endplate degeneration and
left paracentral protrusion. Asymmetric left facet spurring. Left
foraminal narrowing is mild.

L2-L3: Disc narrowing and bulging with mild endplate degeneration.
Early facet spurring

L3-L4: Disc narrowing and bulging with small right inferior
foraminal protrusion. Negative facets

L4-L5: Disc collapse with endplate ridging eccentric to the right.
Asymmetric right facet spurring. Moderate right foraminal narrowing.

L5-S1:Facet osteoarthritis with spurring asymmetric to the left.
Disc bulging.
IMPRESSION: 1. Scoliosis and generalized spinal degeneration.
2. Up to moderate foraminal narrowing on the right at L4-5.
3. Diffusely patent spinal canal and left-sided foramina.

## 2021-07-16 ENCOUNTER — Other Ambulatory Visit: Payer: Self-pay | Admitting: Dermatology

## 2021-08-16 ENCOUNTER — Other Ambulatory Visit: Payer: Self-pay

## 2021-08-16 ENCOUNTER — Ambulatory Visit: Payer: Medicare Other | Admitting: Dermatology

## 2021-08-16 DIAGNOSIS — D2361 Other benign neoplasm of skin of right upper limb, including shoulder: Secondary | ICD-10-CM | POA: Diagnosis not present

## 2021-08-16 DIAGNOSIS — L821 Other seborrheic keratosis: Secondary | ICD-10-CM

## 2021-08-16 DIAGNOSIS — Z1283 Encounter for screening for malignant neoplasm of skin: Secondary | ICD-10-CM

## 2021-08-16 DIAGNOSIS — L820 Inflamed seborrheic keratosis: Secondary | ICD-10-CM

## 2021-08-16 DIAGNOSIS — L578 Other skin changes due to chronic exposure to nonionizing radiation: Secondary | ICD-10-CM

## 2021-08-16 DIAGNOSIS — D229 Melanocytic nevi, unspecified: Secondary | ICD-10-CM

## 2021-08-16 DIAGNOSIS — D2262 Melanocytic nevi of left upper limb, including shoulder: Secondary | ICD-10-CM

## 2021-08-16 DIAGNOSIS — L814 Other melanin hyperpigmentation: Secondary | ICD-10-CM

## 2021-08-16 DIAGNOSIS — D239 Other benign neoplasm of skin, unspecified: Secondary | ICD-10-CM

## 2021-08-16 DIAGNOSIS — L719 Rosacea, unspecified: Secondary | ICD-10-CM

## 2021-08-16 DIAGNOSIS — L304 Erythema intertrigo: Secondary | ICD-10-CM

## 2021-08-16 DIAGNOSIS — D1800 Hemangioma unspecified site: Secondary | ICD-10-CM

## 2021-08-16 NOTE — Patient Instructions (Addendum)
Start Zeasorb AF powder for under breast ? ?Cryotherapy Aftercare ? ?Wash gently with soap and water everyday.   ?Apply Vaseline and Band-Aid daily until healed.  ? ?If You Need Anything After Your Visit ? ?If you have any questions or concerns for your doctor, please call our main line at [phone] and press option 4 to reach your doctor's medical assistant. If no one answers, please leave a voicemail as directed and we will return your call as soon as possible. Messages left after 4 pm will be answered the following business day.  ? ?You may also send [place] a message via MyChart. We typically respond to MyChart messages within 1-2 business days. ? ?For prescription refills, please ask your pharmacy to contact our office. Our fax number is [phone]. ? ?If you have an urgent issue when the clinic is closed that cannot wait until the next business day, you can page your doctor at the number below.   ? ?Please note that while we do our best to be available for urgent issues outside of office hours, we are not available 24/7.  ? ?If you have an urgent issue and are unable to reach [place], you may choose to seek medical care at your doctor's office, retail clinic, urgent care center, or emergency room. ? ?If you have a medical emergency, please immediately call 911 or go to the emergency department. ? ?Pager Numbers ? ?- Dr. Nehemiah Massed: [phone] ? ?- Dr. Laurence Ferrari: [phone] ? ?- Dr. Nicole Kindred: [phone] ? ?In the event of inclement weather, please call our main line at [phone] for an update on the status of any delays or closures. ? ?Dermatology Medication Tips: ?Please keep the boxes that topical medications come in in order to help keep track of the instructions about where and how to use these. Pharmacies typically print the medication instructions only on the boxes and not directly on the medication tubes.  ? ?If your medication is too expensive, please contact our office at [phone] option 4 or send [place] a  message through [place].  ? ?We are unable to tell what your co-pay for medications will be in advance as this is different depending on your insurance coverage. However, we may be able to find a substitute medication at lower cost or fill out paperwork to get insurance to cover a needed medication.  ? ?If a prior authorization is required to get your medication covered by your insurance company, please allow [place] 1-2 business days to complete this process. ? ?Drug prices often vary depending on where the prescription is filled and some pharmacies may offer cheaper prices. ? ?The website www.goodrx.com contains coupons for medications through different pharmacies. The prices here do not account for what the cost may be with help from insurance (it may be cheaper with your insurance), but the website can give you the price if you did not use any insurance.  ?- You can print the associated coupon and take it with your prescription to the pharmacy.  ?- You may also stop by our office during regular business hours and pick up a GoodRx coupon card.  ?- If you need your prescription sent electronically to a different pharmacy, notify our office through [facility] or by phone at [phone] option 4. ? ? ? ? ?Si Usted Necesita Algo Despu?s de Su Visita ? ?Tambi?n puede enviarnos un mensaje a trav?s de MyChart. Por lo general respondemos a los mensajes de MyChart en el transcurso de 1 a 2 d?as  h?biles. ? ?Para renovar recetas, por favor pida a su farmacia que se ponga en contacto con nuestra oficina. Nuestro n?mero de fax es el [phone]. ? ?Si tiene un asunto urgente cuando la cl?nica est? cerrada y que no puede esperar hasta el siguiente d?a h?bil, puede llamar/localizar a su doctor(a) al n?mero que aparece a continuaci?n.  ? ?Por favor, tenga en cuenta que aunque hacemos todo lo posible para estar disponibles para asuntos urgentes fuera del horario de oficina, no estamos disponibles las 24 horas del d?a, los 7  d?as de la semana.  ? ?Si tiene un problema urgente y no puede comunicarse con nosotros, puede optar por buscar atenci?n m?dica  en el consultorio de su doctor(a), en una cl?nica privada, en un centro de atenci?n urgente o en una sala de emergencias. ? ?Si tiene Engineer, maintenance (IT) m?dica, por favor llame inmediatamente al 911 o vaya a la sala de emergencias. ? ?N?meros de b?per ? ?- Dr. Nehemiah Massed: [phone] ? ?- Dra. Moye: [phone] ? ?- Dra. Nicole Kindred: [phone] ? ?En caso de inclemencias del tiempo, por favor llame a nuestra l?nea principal al [phone] para una actualizaci?n sobre el estado de cualquier retraso o cierre. ? ?Consejos para la medicaci?n en dermatolog?a: ?Por favor, guarde las cajas en las que vienen los medicamentos de uso t?pico para ayudarle a seguir las instrucciones sobre d?nde y c?mo usarlos. Las farmacias generalmente imprimen las instrucciones del medicamento s?lo en las cajas y no directamente en los tubos del [place].  ? ?Si su medicamento es muy caro, por favor, p?ngase en contacto con Zigmund Daniel llamando al [phone] y presione la opci?n 4 o env?enos un mensaje a trav?s de MyChart.  ? ?No podemos decirle cu?l ser? su copago por los medicamentos por adelantado ya que esto es diferente dependiendo de la cobertura de su seguro. Sin embargo, es posible que podamos encontrar un medicamento sustituto a Electrical engineer un formulario para que el seguro cubra el medicamento que se considera necesario.  ? ?Si se requiere Ardelia Mems autorizaci?n previa para que su compa??a de seguros [place] su medicamento, por favor perm?tanos de 1 a 2 d?as h?biles para completar este proceso. ? ?Los precios de los medicamentos var?an con frecuencia dependiendo del Environmental consultant de d?nde se surte la receta y alguna farmacias pueden ofrecer precios m?s baratos. ? ?El sitio web www.goodrx.com tiene cupones para medicamentos de Airline pilot. Los precios aqu? no tienen en cuenta lo que podr?a costar con  la ayuda del seguro (puede ser m?s barato con su seguro), pero el sitio web puede darle el precio si no utiliz? ning?n seguro.  ?- Puede imprimir el cup?n correspondiente y llevarlo con su receta a la farmacia.  ?- Tambi?n puede pasar por nuestra oficina durante el horario de atenci?n regular y recoger una tarjeta de cupones de GoodRx.  ?- Si necesita que su receta se env?e electr?nicamente a Chiropodist, informe a nuestra oficina a trav?s de MyChart de [place] o por tel?fono llamando al [phone] y presione la opci?n 4.  ?

## 2021-08-16 NOTE — Progress Notes (Signed)
? ?Follow-Up Visit ?  ?Subjective  ?Ruth Klein is a 71 y.o. female who presents for the following: Total body skin exam (Check spot umbilicus) and Rosacea (Face, didn't use the Azeliac cr ). ? ?The patient presents for Total-Body Skin Exam (TBSE) for skin cancer screening and mole check.  The patient has spots, moles and lesions to be evaluated, some may be new or changing and the patient has concerns that these could be cancer.  New irritated growth in scalp.  ? ?The following portions of the chart were reviewed this encounter and updated as appropriate:  ?  ?  ? ?Review of Systems:  No other skin or systemic complaints except as noted in HPI or Assessment and Plan. ? ?Objective  ?Well appearing patient in no apparent distress; mood and affect are within normal limits. ? ?A full examination was performed including scalp, head, eyes, ears, nose, lips, neck, chest, axillae, abdomen, back, buttocks, bilateral upper extremities, bilateral lower extremities, hands, feet, fingers, toes, fingernails, and toenails. All findings within normal limits unless otherwise noted below. ? ?L lower shoulder ? ?L lower shoulder ?3.0 x 2.4m med dark brown macule ? ?Right Upper Arm ? ?Right Upper Arm ?1.075mdark blue macule ? ?inframammary ?Mild erythema inframammary ? ?face ?Mild erythema mid face ? ?vertex scalp x 1 ?Stuck on waxy paps with erythema ? ? ? ?Assessment & Plan  ? ?Lentigines ?- Scattered tan macules ?- Due to sun exposure ?- Benign-appearing, observe ?- Recommend daily broad spectrum sunscreen SPF 30+ to sun-exposed areas, reapply every 2 hours as needed. ?- Call for any changes ?- back ? ?Seborrheic Keratoses ?- Stuck-on, waxy, tan-brown papules including umbilicus ?- Benign-appearing ?- Discussed benign etiology and prognosis. ?- Observe ?- Call for any changes ?- legs, back, scalp ? ?Melanocytic Nevi ?- Tan-brown and/or pink-flesh-colored symmetric macules and papules ?- Benign appearing on exam today ?-  Observation ?- Call clinic for new or changing moles ?- Recommend daily use of broad spectrum spf 30+ sunscreen to sun-exposed areas. ?- back, abdomen ?  ? ?Hemangiomas ?- Red papules ?- Discussed benign nature ?- Observe ?- Call for any changes ?- trunk ? ?Actinic Damage ?- Chronic condition, secondary to cumulative UV/sun exposure ?- diffuse scaly erythematous macules with underlying dyspigmentation ?- Recommend daily broad spectrum sunscreen SPF 30+ to sun-exposed areas, reapply every 2 hours as needed.  ?- Staying in the shade or wearing long sleeves, sun glasses (UVA+UVB protection) and wide brim hats (4-inch brim around the entire circumference of the hat) are also recommended for sun protection.  ?- Call for new or changing lesions. ?- chest ? ?Skin cancer screening performed today. ? ?Nevus ?L lower shoulder ? ?Stable ?Benign-appearing.  Observation.  Call clinic for new or changing moles.  Recommend daily use of broad spectrum spf 30+ sunscreen to sun-exposed areas.   ? ?Blue nevus ?Right Upper Arm ? ?Stable ?Benign-appearing.  Observation.  Call clinic for new or changing lesions.  Recommend daily use of broad spectrum spf 30+ sunscreen to sun-exposed areas.   ? ?Intertrigo ?inframammary ? ?Chronic condition with duration or expected duration over one year. Currently well-controlled.  ? ?Intertrigo is a chronic recurrent rash that occurs in skin fold areas that may be associated with friction; heat; moisture; yeast; fungus; and bacteria.  It is exacerbated by increased movement / activity; sweating; and higher atmospheric temperature. ? ?Cont Ketoconazole 2% cr qd prn flares ?Start Zeasorb AF powder qd ? ?Rosacea ?face ? ?Rosacea is a chronic  progressive skin condition usually affecting the face of adults, causing redness and/or acne bumps. It is treatable but not curable. It sometimes affects the eyes (ocular rosacea) as well. It may respond to topical and/or systemic medication and can flare with stress,  sun exposure, alcohol, exercise and some foods.  Daily application of broad spectrum spf 30+ sunscreen to face is recommended to reduce flares. ? ?restart Azeliac Acid cr 20% qhs to face prn flares ? ?Related Medications ?azelaic acid (AZELEX) 20 % cream ?Apply to affected areas cheeks and nose once a day for rosacea ? ?metroNIDAZOLE (METROCREAM) 0.75 % cream ?Apply to face 1-2 times a day as needed ? ?Inflamed seborrheic keratosis ?vertex scalp x 1 ? ?Destruction of lesion - vertex scalp x 1 ? ?Destruction method: cryotherapy   ?Informed consent: discussed and consent obtained   ?Lesion destroyed using liquid nitrogen: Yes   ?Region frozen until ice ball extended beyond lesion: Yes   ?Outcome: patient tolerated procedure well with no complications   ?Post-procedure details: wound care instructions given   ?Additional details:  Prior to procedure, discussed risks of blister formation, small wound, skin dyspigmentation, or rare scar following cryotherapy. Recommend Vaseline ointment to treated areas while healing.  ? ? ?Return in about 6 months (around 02/16/2022) for ISK spot check. ? ?I, Ruth Klein, RMA, am acting as scribe for Ruth Patty, MD . ? ?Documentation: I have reviewed the above documentation for accuracy and completeness, and I agree with the above. ? ?Ruth Patty MD  ? ?

## 2021-10-20 ENCOUNTER — Emergency Department: Payer: Medicare Other

## 2021-10-20 ENCOUNTER — Observation Stay
Admission: EM | Admit: 2021-10-20 | Discharge: 2021-10-22 | Disposition: A | Discharge status: Home / Self Care | Payer: Medicare Other | Attending: Internal Medicine | Admitting: Internal Medicine

## 2021-10-20 ENCOUNTER — Other Ambulatory Visit: Payer: Self-pay

## 2021-10-20 DIAGNOSIS — G459 Transient cerebral ischemic attack, unspecified: Secondary | ICD-10-CM | POA: Diagnosis not present

## 2021-10-20 DIAGNOSIS — U071 COVID-19: Secondary | ICD-10-CM | POA: Insufficient documentation

## 2021-10-20 DIAGNOSIS — Z79899 Other long term (current) drug therapy: Secondary | ICD-10-CM | POA: Diagnosis not present

## 2021-10-20 DIAGNOSIS — Z853 Personal history of malignant neoplasm of breast: Secondary | ICD-10-CM | POA: Insufficient documentation

## 2021-10-20 DIAGNOSIS — I480 Paroxysmal atrial fibrillation: Secondary | ICD-10-CM | POA: Insufficient documentation

## 2021-10-20 DIAGNOSIS — R410 Disorientation, unspecified: Secondary | ICD-10-CM

## 2021-10-20 DIAGNOSIS — R4182 Altered mental status, unspecified: Secondary | ICD-10-CM | POA: Diagnosis present

## 2021-10-20 DIAGNOSIS — Z87891 Personal history of nicotine dependence: Secondary | ICD-10-CM | POA: Insufficient documentation

## 2021-10-20 DIAGNOSIS — F419 Anxiety disorder, unspecified: Secondary | ICD-10-CM | POA: Diagnosis present

## 2021-10-20 DIAGNOSIS — Z7901 Long term (current) use of anticoagulants: Secondary | ICD-10-CM | POA: Diagnosis not present

## 2021-10-20 DIAGNOSIS — R2689 Other abnormalities of gait and mobility: Secondary | ICD-10-CM | POA: Insufficient documentation

## 2021-10-20 DIAGNOSIS — F32A Depression, unspecified: Secondary | ICD-10-CM | POA: Diagnosis present

## 2021-10-20 LAB — URINALYSIS, ROUTINE W REFLEX MICROSCOPIC
Bilirubin Urine: NEGATIVE
Glucose, UA: NEGATIVE mg/dL
Hgb urine dipstick: NEGATIVE
Ketones, ur: NEGATIVE mg/dL
Leukocytes,Ua: NEGATIVE
Nitrite: NEGATIVE
Protein, ur: NEGATIVE mg/dL
Specific Gravity, Urine: 1.012 (ref 1.005–1.030)
pH: 7 (ref 5.0–8.0)

## 2021-10-20 LAB — CBC WITH DIFFERENTIAL/PLATELET
Abs Immature Granulocytes: 0.03 10*3/uL (ref 0.00–0.07)
Basophils Absolute: 0 10*3/uL (ref 0.0–0.1)
Basophils Relative: 0 %
Eosinophils Absolute: 0 10*3/uL (ref 0.0–0.5)
Eosinophils Relative: 0 %
HCT: 42.8 % (ref 36.0–46.0)
Hemoglobin: 14.1 g/dL (ref 12.0–15.0)
Immature Granulocytes: 0 %
Lymphocytes Relative: 13 %
Lymphs Abs: 1.4 10*3/uL (ref 0.7–4.0)
MCH: 30.9 pg (ref 26.0–34.0)
MCHC: 32.9 g/dL (ref 30.0–36.0)
MCV: 93.7 fL (ref 80.0–100.0)
Monocytes Absolute: 0.5 10*3/uL (ref 0.1–1.0)
Monocytes Relative: 5 %
Neutro Abs: 8.5 10*3/uL — ABNORMAL HIGH (ref 1.7–7.7)
Neutrophils Relative %: 82 %
Platelets: 294 10*3/uL (ref 150–400)
RBC: 4.57 MIL/uL (ref 3.87–5.11)
RDW: 13.2 % (ref 11.5–15.5)
WBC: 10.5 10*3/uL (ref 4.0–10.5)
nRBC: 0 % (ref 0.0–0.2)

## 2021-10-20 LAB — BASIC METABOLIC PANEL
Anion gap: 7 (ref 5–15)
BUN: 22 mg/dL (ref 8–23)
CO2: 29 mmol/L (ref 22–32)
Calcium: 10.5 mg/dL — ABNORMAL HIGH (ref 8.9–10.3)
Chloride: 102 mmol/L (ref 98–111)
Creatinine, Ser: 0.72 mg/dL (ref 0.44–1.00)
GFR, Estimated: >60 mL/min (ref >60)
Glucose, Bld: 162 mg/dL — ABNORMAL HIGH (ref 70–99)
Potassium: 4.1 mmol/L (ref 3.5–5.1)
Sodium: 138 mmol/L (ref 135–145)

## 2021-10-20 IMAGING — MR MR HEAD W/O CM
11 series · 48 of 48 positions shown · non-contrast
Comparison: None Available.

CLINICAL DATA: Initial evaluation for acute aphasia.

EXAM:
MRI HEAD WITHOUT CONTRAST
TECHNIQUE: Multiplanar, multiecho pulse sequences of the brain and surrounding
structures were obtained without intravenous contrast.

[Series 5: ax dwi_tracew · axial · 3.0mm · 0.65mm/px · z∈[-75,+76]mm · 4 of 48 slices shown]
[im 1/48]
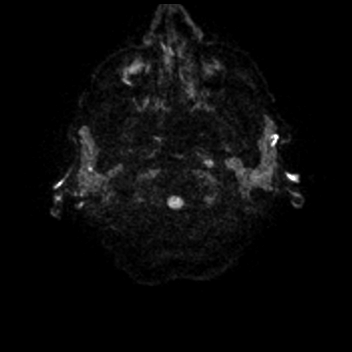
[im 16/48]
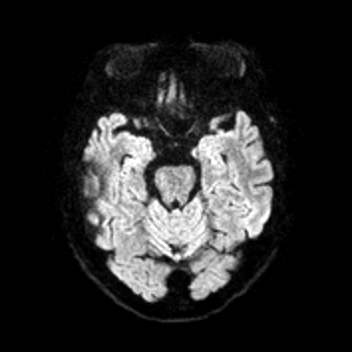
[im 32/48]
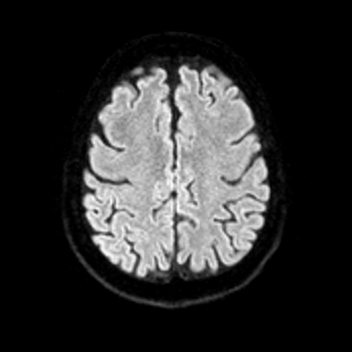
[im 48/48]
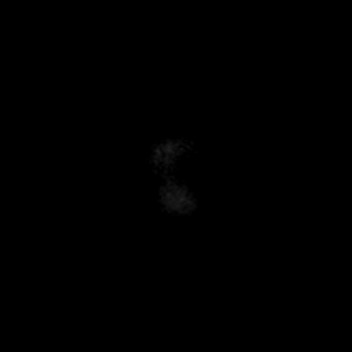

[Series 6: ax dwi_adc · axial · 3.0mm · 0.65mm/px · z∈[-75,+73]mm · 4 of 47 slices shown]
[im 1/47]
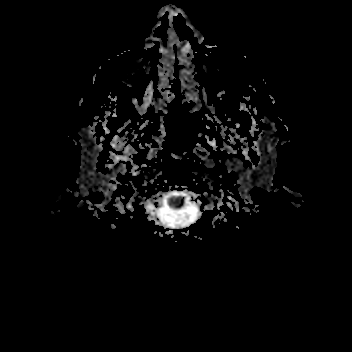
[im 16/47]
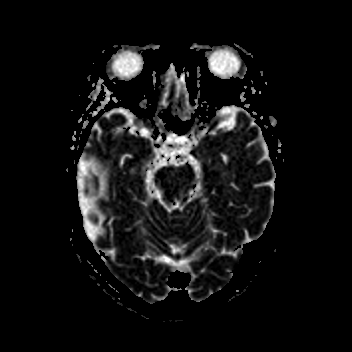
[im 31/47]
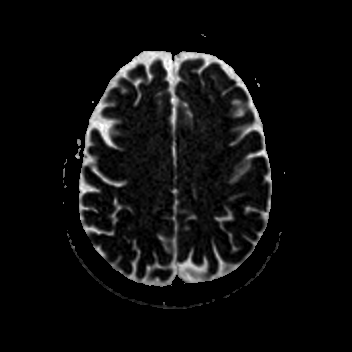
[im 47/47]
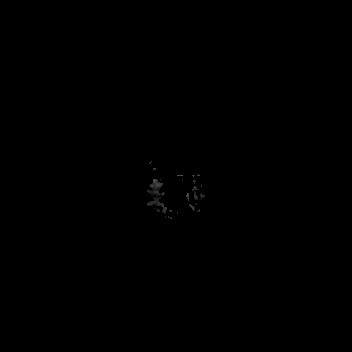

[Series 7: cor dwi_tracew · coronal · 5.0mm · 0.65mm/px · 3 of 40 slices shown]
[im 1/40]
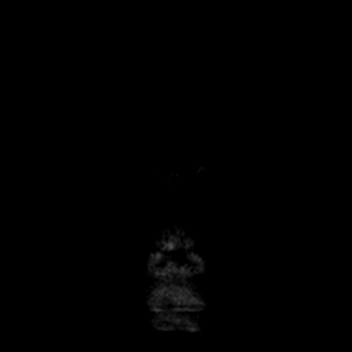
[im 20/40]
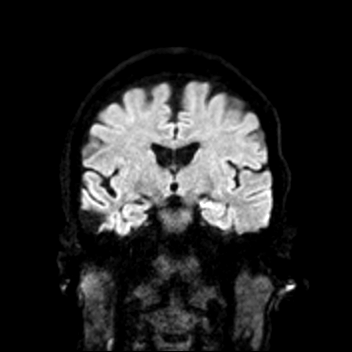
[im 40/40]
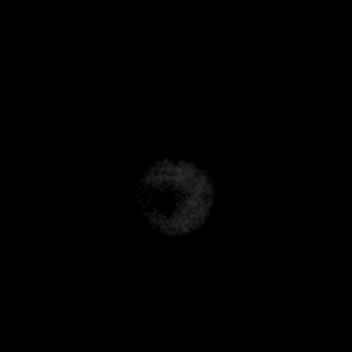

[Series 8: cor dwi_adc · coronal · 5.0mm · 0.65mm/px · 3 of 40 slices shown]
[im 1/40]
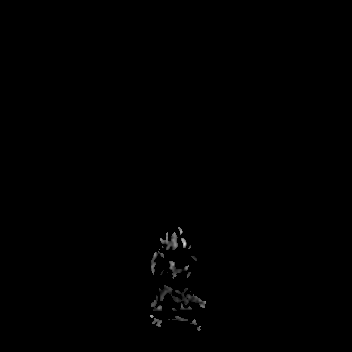
[im 20/40]
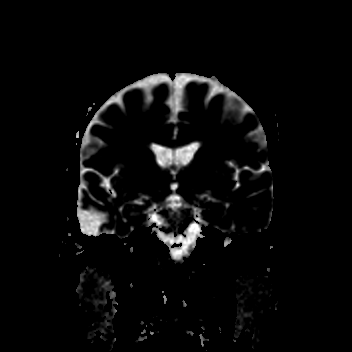
[im 40/40]
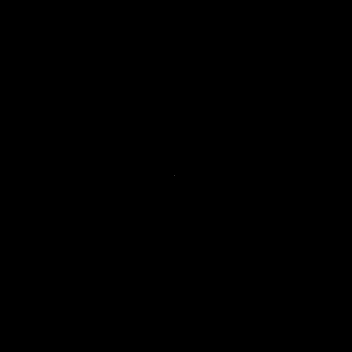

[Series 9: T1 · sagittal · 5.0mm · 0.62mm/px · 2 of 21 slices shown (1 of 2)]
[im 1/21]
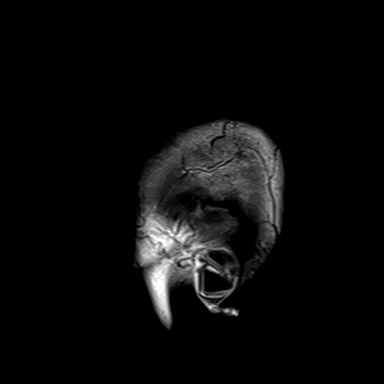
[im 21/21]
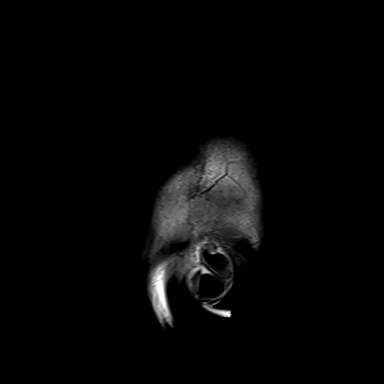

[Series 10: T2 · axial · 5.0mm · 0.53mm/px · z∈[-68,+72]mm · 2 of 25 slices shown (1 of 2)]
[im 1/25]
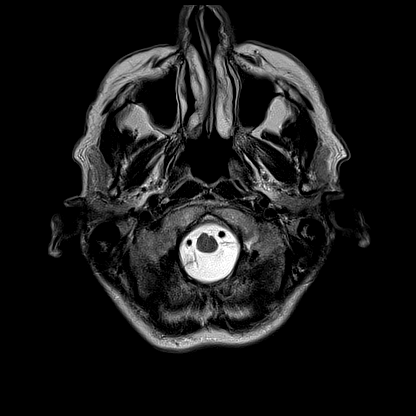
[im 25/25]
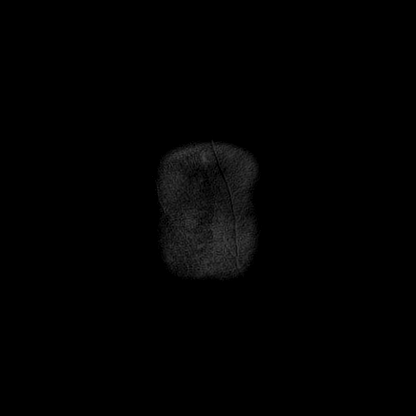

[Series 12: pha_images · axial · 3.0mm · 0.90mm/px · z∈[-79,+76]mm · 5 of 54 slices shown]
[im 1/54]
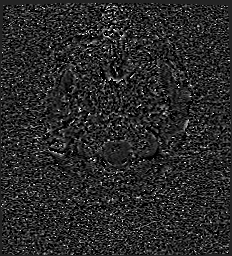
[im 14/54]
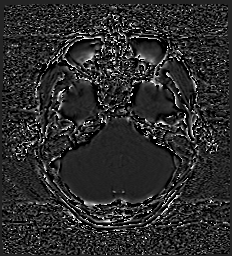
[im 27/54]
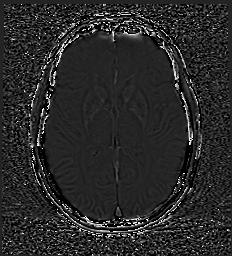
[im 40/54]
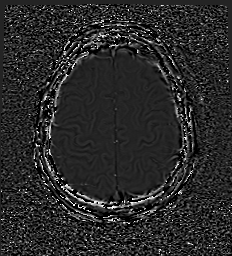
[im 54/54]
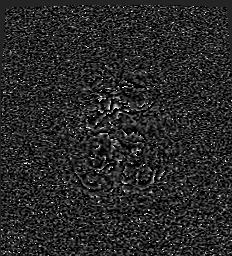

[Series 13: swi_images · axial · 3.0mm · 0.90mm/px · z∈[-79,+82]mm · 5 of 56 slices shown]
[im 1/56]
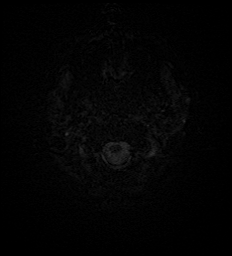
[im 14/56]
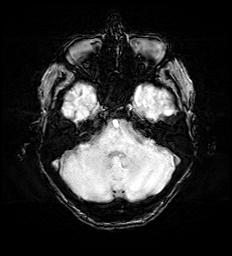
[im 28/56]
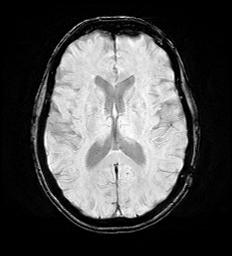
[im 42/56]
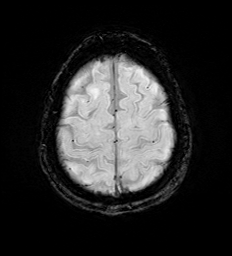
[im 56/56]
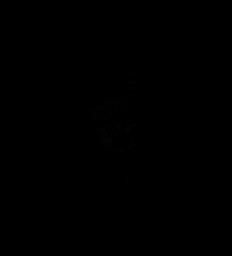

[Series 15: FLAIR · axial · 3.0mm · 0.69mm/px · z∈[-77,+81]mm · 5 of 55 slices shown]
[im 1/55]
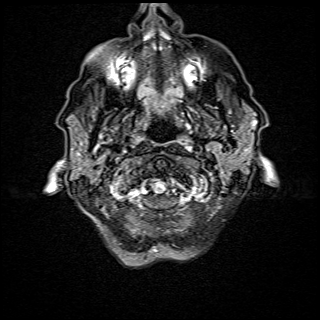
[im 14/55]
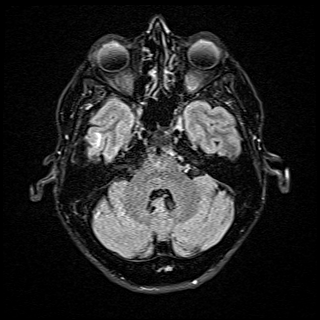
[im 28/55]
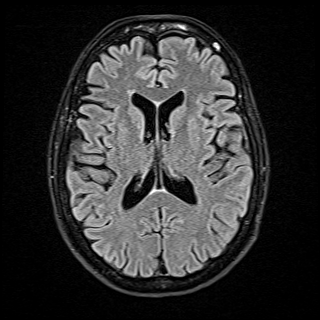
[im 41/55]
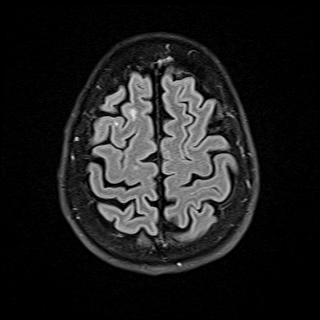
[im 55/55]
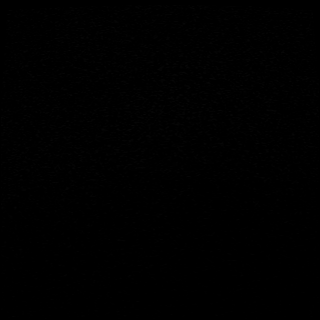

[Series 16: T1 · axial · 1.0mm · 0.98mm/px · z∈[-79,+76]mm · 13 of 160 slices shown (2 of 2)]
[im 1/160]
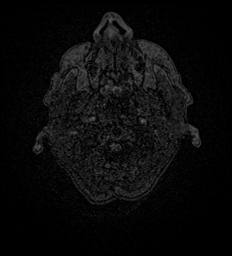
[im 14/160]
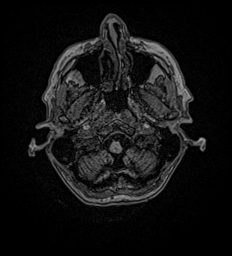
[im 27/160]
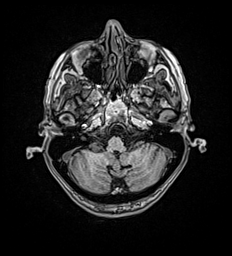
[im 40/160]
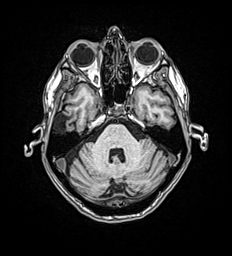
[im 54/160]
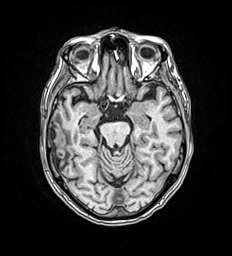
[im 67/160]
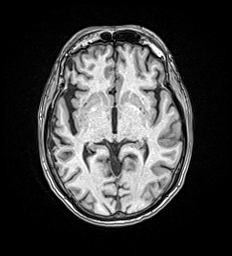
[im 80/160]
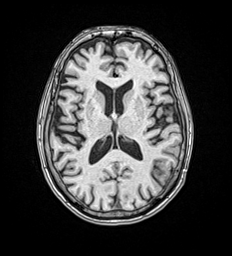
[im 93/160]
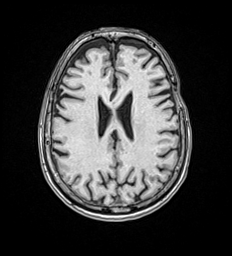
[im 107/160]
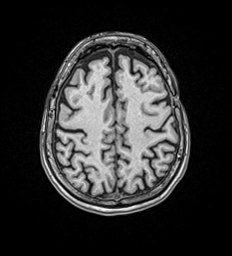
[im 120/160]
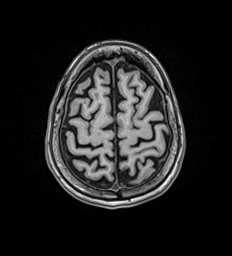
[im 133/160]
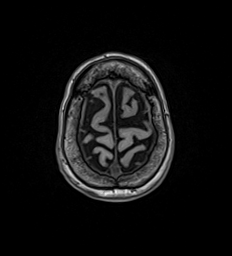
[im 146/160]
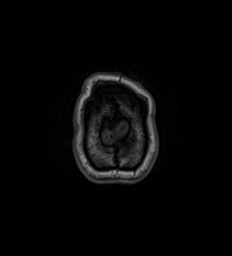
[im 160/160]
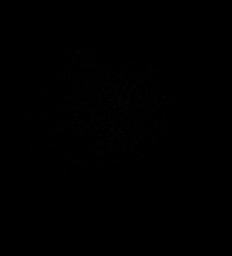

[Series 17: T2 · coronal · 5.0mm · 0.59mm/px · 2 of 29 slices shown (2 of 2)]
[im 1/29]
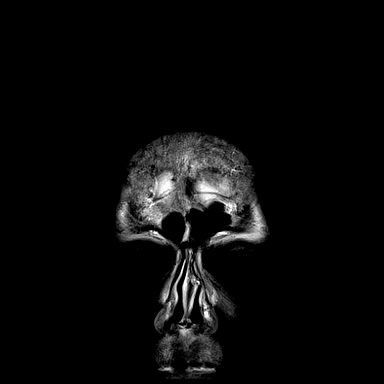
[im 29/29]
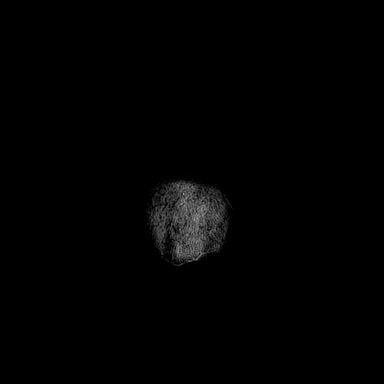

[48 of 48 positions shown; findings below may reference images not displayed]

FINDINGS: Brain: Cerebral volume within normal limits. Focal encephalomalacia
of and gliosis involving the peripheral right temporal lobe is seen.
Few additional small foci of encephalomalacia and gliosis noted at
the high anterior frontal lobes bilaterally (series 15, image 43).
Findings most likely related to prior traumatic brain injury.
Possible superimposed arachnoid cyst at the peripheral right
temporal lobe measures approximately 2 cm (series 10, image 7). No
significant regional mass effect.

No evidence for acute or subacute ischemia. No other areas of
chronic cortical infarction or other insult. No visible acute or
chronic intracranial blood products.

No other mass lesion, mass effect or midline shift. No hydrocephalus
or extra-axial collection. Pituitary gland suprasellar region within
normal limits.

Vascular: Major intracranial vascular flow voids are maintained.

Skull and upper cervical spine: Craniocervical junction within
normal limits. Bone marrow signal intensity normal. No acute scalp
soft tissue abnormality.

Sinuses/Orbits: Prior bilateral ocular lens replacement. Mild
scattered mucosal thickening noted within the ethmoidal air cells
and maxillary sinuses. No mastoid effusion.

Other: None.
IMPRESSION: 1. No acute intracranial abnormality.
2. Sequelae of prior traumatic brain injury with multifocal areas of
encephalomalacia involving the high anterior frontal lobes and right
temporal lobe.
3. Possible superimposed 2 cm arachnoid cyst at the peripheral right
temporal lobe. No significant regional mass effect.

## 2021-10-20 MED ORDER — LORAZEPAM 2 MG/ML IJ SOLN
1.0000 mg | Freq: Once | INTRAMUSCULAR | Status: AC
  Administered 2021-10-20: 1 mg via INTRAVENOUS
  Filled 2021-10-20: qty 1

## 2021-10-20 NOTE — ED Provider Notes (Signed)
[facility] Provider Note    Event Date/Time   First MD Initiated Contact with Patient 10/20/21 2106     (approximate)   History   Altered Mental Status   HPI  Ruth Klein is a 71 y.o. female  who, per internal medicine note dated 08/17/21 has history of afib, breast cancer, anxiety, who presents to the emergency department today because of concern for altered mental status.  The patient states that she noticed being confused this morning.  Feels like she is having a hard time getting her words out.  Did recently finished a course of antivirals for COVID.  She states in terms of her COVID symptoms she has felt like those improved greatly.  She denies any associated headache or weakness in her arms or legs.  Denies similar symptoms in the past.    Physical Exam   Triage Vital Signs: ED Triage Vitals  Enc Vitals Group     BP 10/20/21 2026 (!) 150/84     Pulse Rate 10/20/21 2026 70     Resp 10/20/21 2026 16     Temp 10/20/21 2026 97.9 F (36.6 C)     Temp Source 10/20/21 2026 Oral     SpO2 10/20/21 2026 100 %     Weight 10/20/21 2027 135 lb 2.3 oz (61.3 kg)     Height 10/20/21 2027 '5\' 4"'$  (1.626 m)     Head Circumference --      Peak Flow --      Pain Score 10/20/21 2026 0   Most recent vital signs: Vitals:   10/20/21 2026  BP: (!) 150/84  Pulse: 70  Resp: 16  Temp: 97.9 F (36.6 C)  SpO2: 100%   General: Awake, alert, oriented. CV:  Good peripheral perfusion. Regular rate and rhythm. Resp:  Normal effort. Lungs clear Abd:  No distention.  Other:  Moving all extremities, strength 5/5 in all extremities. Sensation intact. Difficulty with word finding.    ED Results / Procedures / Treatments   Labs (all labs ordered are listed, but only abnormal results are displayed) Labs Reviewed  CBC WITH DIFFERENTIAL/PLATELET - Abnormal; Notable for the following components:      Result Value   Neutro Abs 8.5 (*)    All other components within normal  limits  BASIC METABOLIC PANEL - Abnormal; Notable for the following components:   Glucose, Bld 162 (*)    Calcium 10.5 (*)    All other components within normal limits  URINALYSIS, ROUTINE W REFLEX MICROSCOPIC - Abnormal; Notable for the following components:   Color, Urine YELLOW (*)    APPearance CLOUDY (*)    All other components within normal limits     EKG  I, Nance Pear, attending physician, personally viewed and interpreted this EKG  EKG Time: 2028 Rate: 68 Rhythm: sinus rhythm Axis: normal Intervals: qtc 446 QRS: narrow ST changes: no st elevation Impression: normal ekg    RADIOLOGY MRI pending   PROCEDURES:  Critical Care performed: No  Procedures   MEDICATIONS ORDERED IN ED: Medications - No data to display   IMPRESSION / MDM / [place] / ED COURSE  I reviewed the triage vital signs and the nursing notes.                              Differential diagnosis includes, but is not limited to, CVA, UTI, electrolyte abnormality.  Patient presented to  the emergency department today because of concerns for altered mental status.  On exam patient is having some difficulty with word finding.  Patient's blood work without concerning abnormality.  Given concern for CVA MRI was ordered.  Patient's presentation is most consistent with acute presentation with potential threat to life or bodily function.     FINAL CLINICAL IMPRESSION(S) / ED DIAGNOSES   Final diagnoses:  Confusion     Note:  This document was prepared using Dragon voice recognition software and may include unintentional dictation errors.    Nance Pear, MD 10/20/21 2258

## 2021-10-20 NOTE — ED Triage Notes (Signed)
Sent in by husband via [employer], broadcasting/film/video for AMS. Medic states patient alert and oriented x 4 . Patient tested COVID + last Wednesday, took last antiviral today.

## 2021-10-21 ENCOUNTER — Observation Stay: Payer: Medicare Other

## 2021-10-21 DIAGNOSIS — R4702 Dysphasia: Secondary | ICD-10-CM | POA: Diagnosis not present

## 2021-10-21 DIAGNOSIS — I480 Paroxysmal atrial fibrillation: Secondary | ICD-10-CM | POA: Diagnosis not present

## 2021-10-21 DIAGNOSIS — F419 Anxiety disorder, unspecified: Secondary | ICD-10-CM | POA: Diagnosis not present

## 2021-10-21 DIAGNOSIS — R410 Disorientation, unspecified: Secondary | ICD-10-CM

## 2021-10-21 DIAGNOSIS — F32A Depression, unspecified: Secondary | ICD-10-CM

## 2021-10-21 DIAGNOSIS — G459 Transient cerebral ischemic attack, unspecified: Secondary | ICD-10-CM | POA: Diagnosis not present

## 2021-10-21 LAB — LIPID PANEL
Cholesterol: 197 mg/dL (ref 0–200)
HDL: 69 mg/dL (ref >40)
LDL Cholesterol: 117 mg/dL — ABNORMAL HIGH (ref 0–99)
Total CHOL/HDL Ratio: 2.9 RATIO
Triglycerides: 53 mg/dL (ref <150)
VLDL: 11 mg/dL (ref 0–40)

## 2021-10-21 LAB — HEMOGLOBIN A1C
Hgb A1c MFr Bld: 5.7 % — ABNORMAL HIGH (ref 4.8–5.6)
Mean Plasma Glucose: 116.89 mg/dL

## 2021-10-21 LAB — CBC
HCT: 38.1 % (ref 36.0–46.0)
Hemoglobin: 12.7 g/dL (ref 12.0–15.0)
MCH: 30.9 pg (ref 26.0–34.0)
MCHC: 33.3 g/dL (ref 30.0–36.0)
MCV: 92.7 fL (ref 80.0–100.0)
Platelets: 288 10*3/uL (ref 150–400)
RBC: 4.11 MIL/uL (ref 3.87–5.11)
RDW: 13.4 % (ref 11.5–15.5)
WBC: 6.9 10*3/uL (ref 4.0–10.5)
nRBC: 0 % (ref 0.0–0.2)

## 2021-10-21 LAB — RESP PANEL BY RT-PCR (FLU A&B, COVID) ARPGX2
Influenza A by PCR: NEGATIVE
Influenza B by PCR: NEGATIVE
SARS Coronavirus 2 by RT PCR: POSITIVE — AB

## 2021-10-21 LAB — BASIC METABOLIC PANEL
Anion gap: 6 (ref 5–15)
BUN: 22 mg/dL (ref 8–23)
CO2: 28 mmol/L (ref 22–32)
Calcium: 9.6 mg/dL (ref 8.9–10.3)
Chloride: 105 mmol/L (ref 98–111)
Creatinine, Ser: 0.53 mg/dL (ref 0.44–1.00)
GFR, Estimated: >60 mL/min (ref >60)
Glucose, Bld: 103 mg/dL — ABNORMAL HIGH (ref 70–99)
Potassium: 3.9 mmol/L (ref 3.5–5.1)
Sodium: 139 mmol/L (ref 135–145)

## 2021-10-21 IMAGING — US US CAROTID DUPLEX BILAT
1 series · 13 of 24 positions shown · non-contrast
Comparison: None Available.

CLINICAL DATA: Aphasia

EXAM:
BILATERAL CAROTID DUPLEX ULTRASOUND
TECHNIQUE: Gray scale imaging, color Doppler and duplex ultrasound were
performed of bilateral carotid and vertebral arteries in the neck.

[Series 1: us carotid bilateral · 13 of 62 slices shown]
[im 1/62]
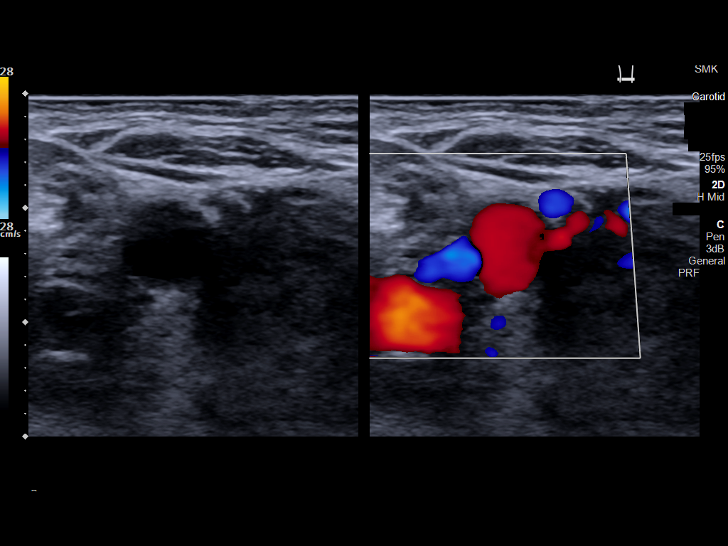
[im 6/62]
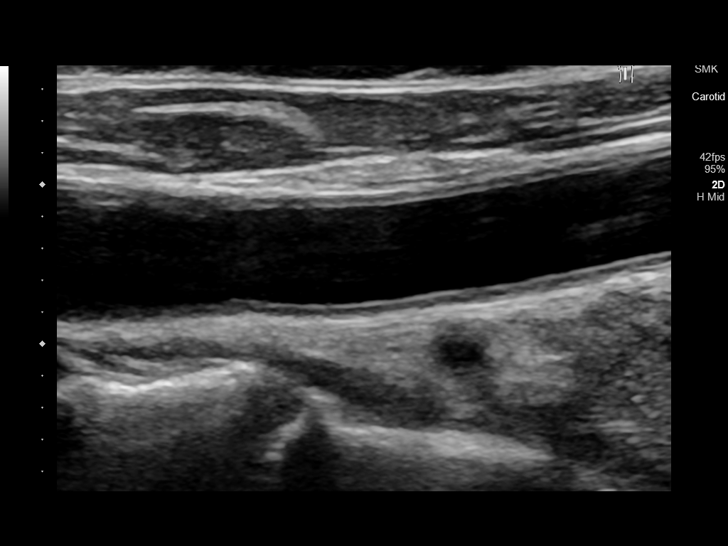
[im 11/62]
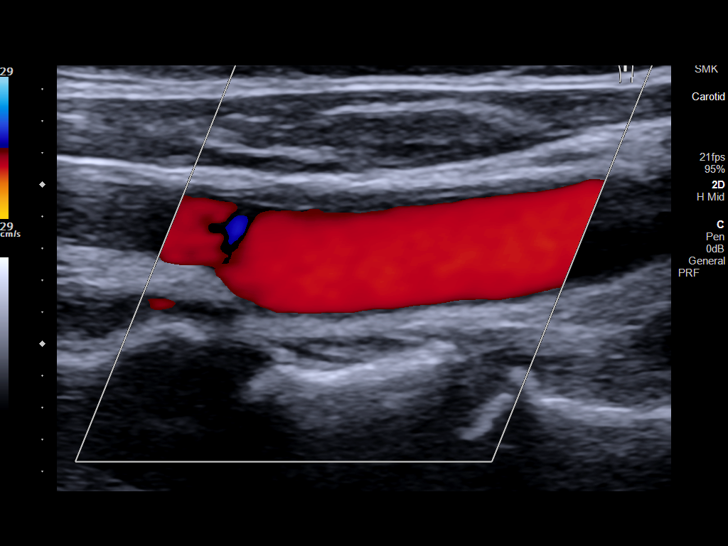
[im 16/62]
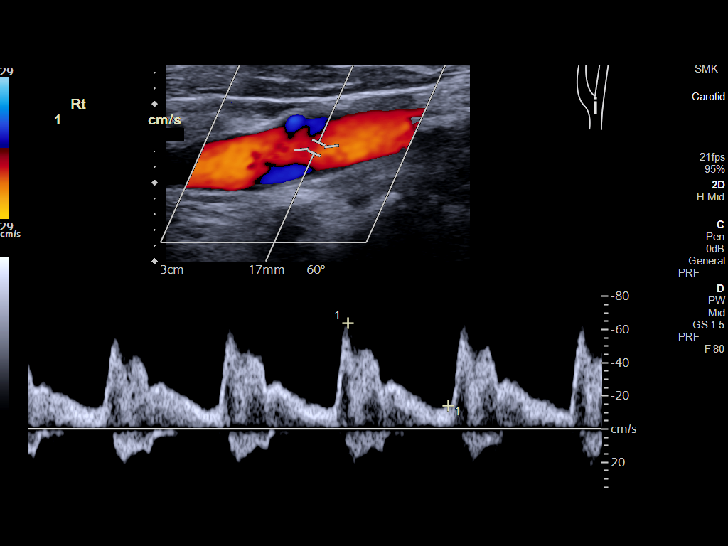
[im 22/62]
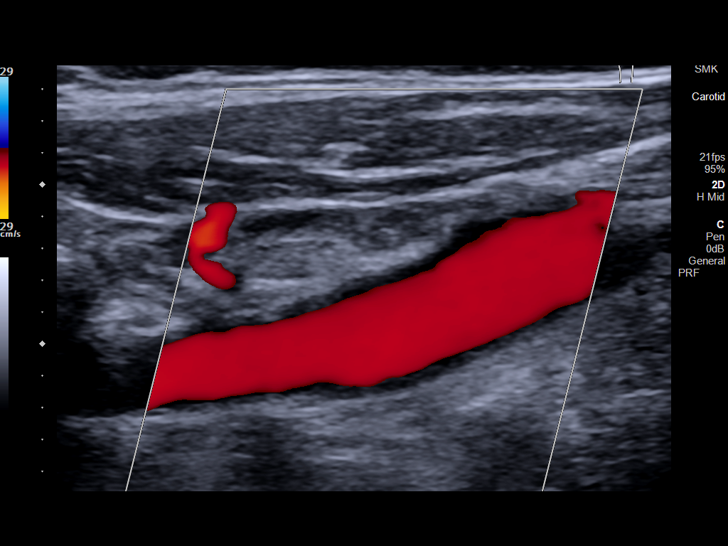
[im 27/62]
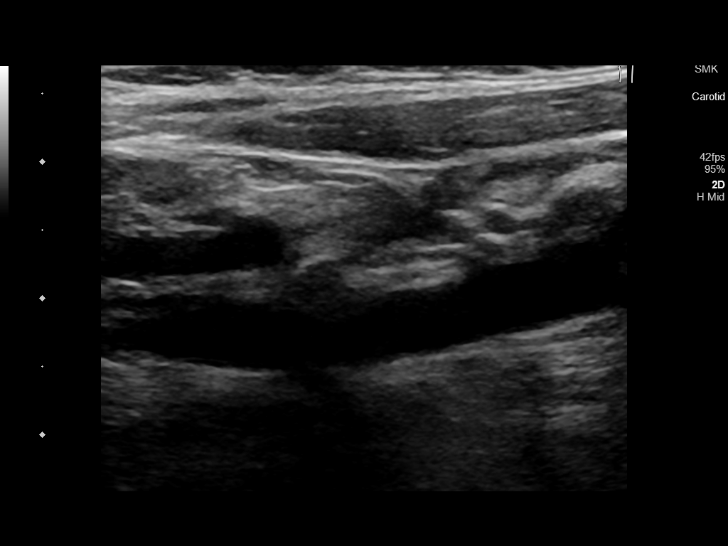
[im 32/62]
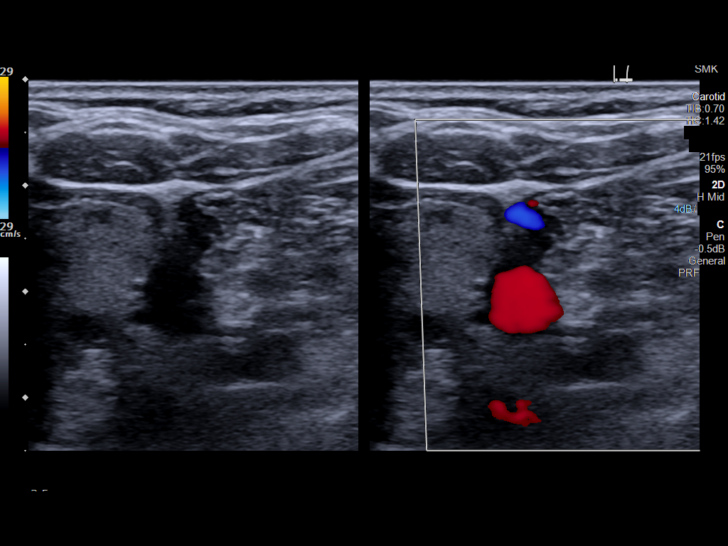
[im 35/62]
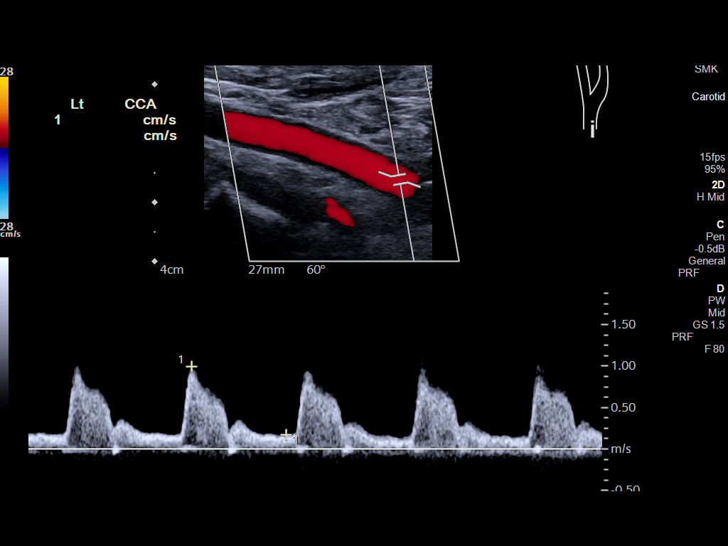
[im 40/62]
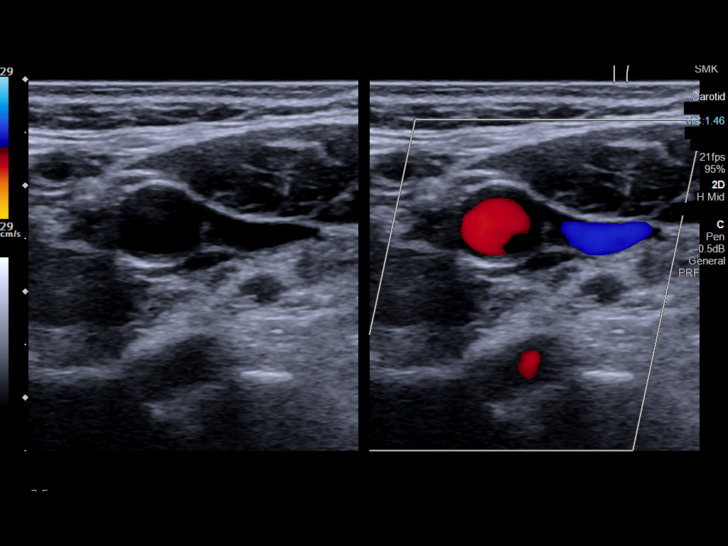
[im 46/62]
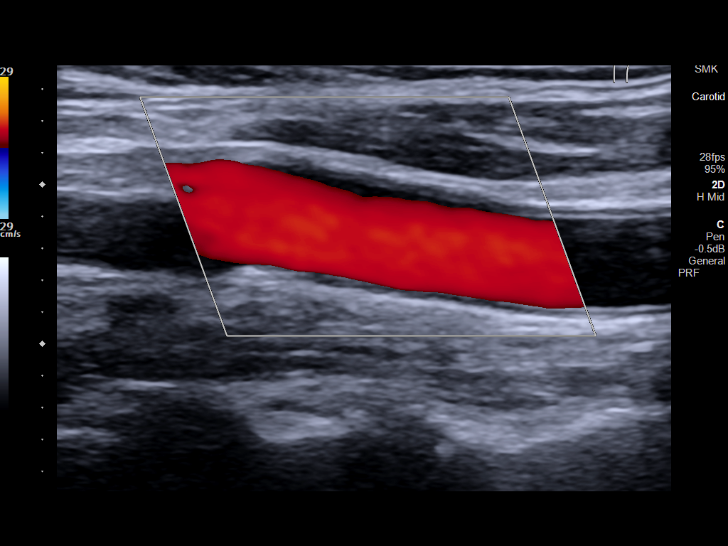
[im 51/62]
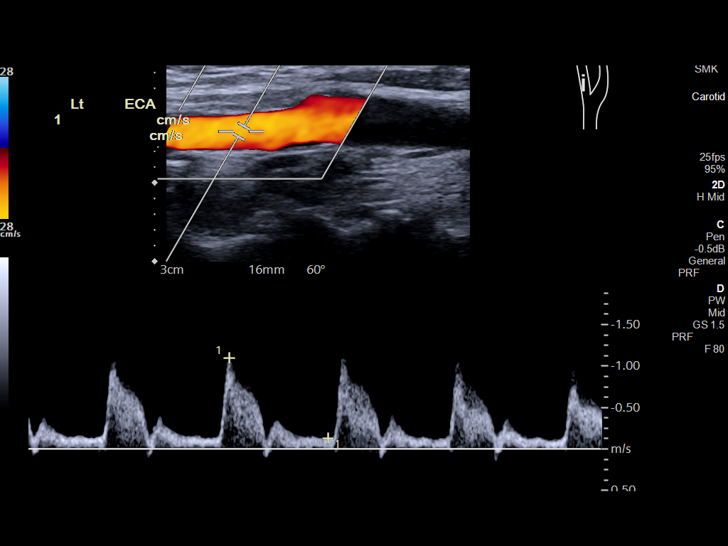
[im 56/62]
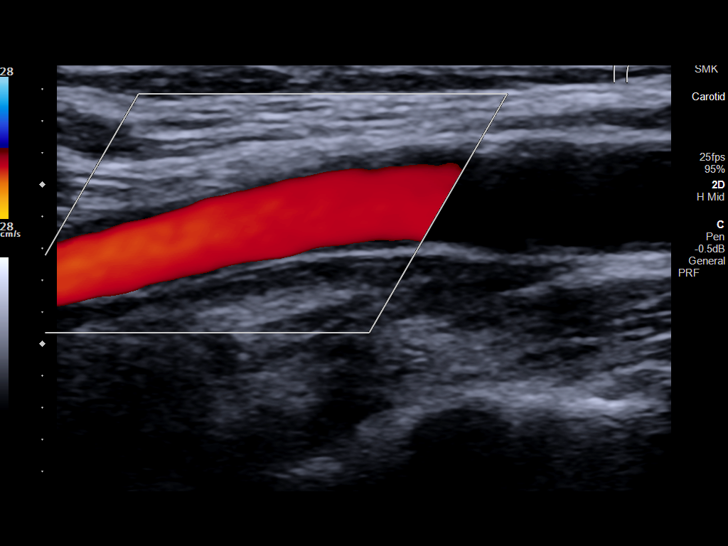
[im 62/62]
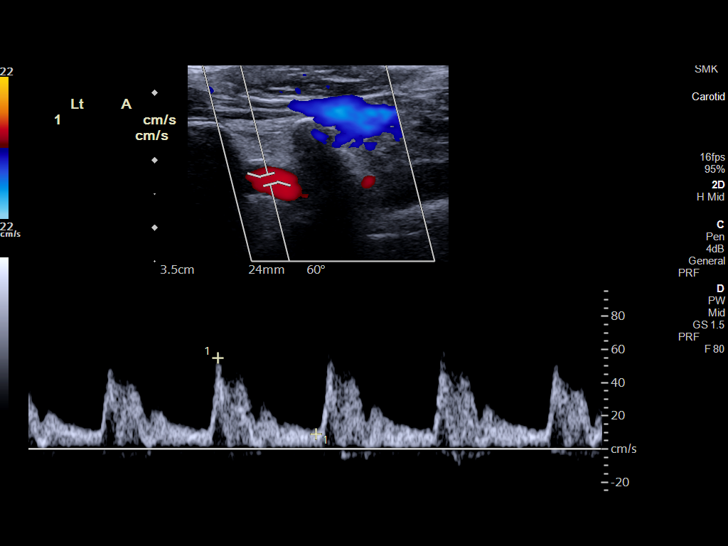

[13 of 24 positions shown; findings below may reference images not displayed]

FINDINGS: Criteria: Quantification of carotid stenosis is based on velocity
parameters that correlate the residual internal carotid diameter
with NASCET-based stenosis levels, using the diameter of the distal
internal carotid lumen as the denominator for stenosis measurement.

The following velocity measurements were obtained:

RIGHT

ICA: 77/21 cm/sec

CCA: 74/14 cm/sec

SYSTOLIC ICA/CCA RATIO:

ECA: 117 cm/sec

LEFT

ICA: 76/23 cm/sec

CCA: 78/14 cm/sec

SYSTOLIC ICA/CCA RATIO:

ECA: 110 cm/sec

RIGHT CAROTID ARTERY: Minimal atherosclerotic plaque in the carotid
bulb and proximal bifurcation. No stenosis by Doppler criteria.
Normal low resistance waveforms in the internal carotid artery.

RIGHT VERTEBRAL ARTERY:  Antegrade flow

LEFT CAROTID ARTERY: No significant atherosclerotic plaque. No
stenosis by Doppler criteria. Normal low resistance waveforms.

LEFT VERTEBRAL ARTERY:  Antegrade flow
IMPRESSION: 1. Minimal atherosclerotic plaque in the right carotid bulb and
proximal bifurcation. No significant atherosclerotic plaque in the
left carotid circulation. No findings of stenosis by Doppler
criteria bilaterally.
2. Bilateral vertebral arteries demonstrate normal antegrade flow.

## 2021-10-21 MED ORDER — BUSPIRONE HCL 10 MG PO TABS
30.0000 mg | ORAL_TABLET | Freq: Two times a day (BID) | ORAL | Status: DC
  Administered 2021-10-21 – 2021-10-22 (×3): 30 mg via ORAL
  Filled 2021-10-21 (×3): qty 3

## 2021-10-21 MED ORDER — METRONIDAZOLE 0.75 % EX CREA
TOPICAL_CREAM | Freq: Two times a day (BID) | CUTANEOUS | Status: DC
Start: 2021-10-21 — End: 2021-10-21

## 2021-10-21 MED ORDER — ACETAMINOPHEN 325 MG PO TABS: 650.0000 mg | ORAL_TABLET | Freq: Four times a day (QID) | ORAL | Status: DC | PRN

## 2021-10-21 MED ORDER — TRAZODONE HCL 50 MG PO TABS: 25.0000 mg | ORAL_TABLET | Freq: Every evening | ORAL | Status: DC | PRN

## 2021-10-21 MED ORDER — DULOXETINE HCL 30 MG PO CPEP
90.0000 mg | ORAL_CAPSULE | Freq: Every day | ORAL | Status: DC
  Administered 2021-10-21: 90 mg via ORAL
  Administered 2021-10-22: 60 mg via ORAL
  Filled 2021-10-21 (×2): qty 3

## 2021-10-21 MED ORDER — ACETAMINOPHEN 650 MG RE SUPP: 650.0000 mg | Freq: Four times a day (QID) | RECTAL | Status: DC | PRN

## 2021-10-21 MED ORDER — STROKE: EARLY STAGES OF RECOVERY BOOK: Freq: Once | Status: AC

## 2021-10-21 MED ORDER — CLOPIDOGREL BISULFATE 75 MG PO TABS
75.0000 mg | ORAL_TABLET | Freq: Every day | ORAL | Status: DC
  Administered 2021-10-21: 75 mg via ORAL
  Filled 2021-10-21: qty 1

## 2021-10-21 MED ORDER — APIXABAN 5 MG PO TABS
5.0000 mg | ORAL_TABLET | Freq: Two times a day (BID) | ORAL | Status: DC
  Administered 2021-10-21 – 2021-10-22 (×3): 5 mg via ORAL
  Filled 2021-10-21 (×3): qty 1

## 2021-10-21 MED ORDER — ONDANSETRON HCL 4 MG PO TABS: 4.0000 mg | ORAL_TABLET | Freq: Four times a day (QID) | ORAL | Status: DC | PRN

## 2021-10-21 MED ORDER — MAGNESIUM HYDROXIDE 400 MG/5ML PO SUSP: 30.0000 mL | Freq: Every day | ORAL | Status: DC | PRN

## 2021-10-21 MED ORDER — ADULT MULTIVITAMIN W/MINERALS CH
1.0000 | ORAL_TABLET | Freq: Every day | ORAL | Status: DC
  Administered 2021-10-21 – 2021-10-22 (×2): 1 via ORAL
  Filled 2021-10-21 (×2): qty 1

## 2021-10-21 MED ORDER — CALCIUM CARBONATE 1250 (500 CA) MG PO TABS
1250.0000 mg | ORAL_TABLET | Freq: Every day | ORAL | Status: DC
  Administered 2021-10-21 – 2021-10-22 (×2): 1250 mg via ORAL
  Filled 2021-10-21 (×2): qty 1

## 2021-10-21 MED ORDER — ENSURE ENLIVE PO LIQD: 237.0000 mL | Freq: Two times a day (BID) | ORAL | Status: DC

## 2021-10-21 MED ORDER — ASPIRIN 81 MG PO CHEW
324.0000 mg | CHEWABLE_TABLET | Freq: Once | ORAL | Status: AC
  Administered 2021-10-21: 324 mg via ORAL
  Filled 2021-10-21: qty 4

## 2021-10-21 MED ORDER — ARIPIPRAZOLE 2 MG PO TABS
2.0000 mg | ORAL_TABLET | Freq: Every day | ORAL | Status: DC
  Administered 2021-10-21 – 2021-10-22 (×2): 2 mg via ORAL
  Filled 2021-10-21 (×2): qty 1

## 2021-10-21 MED ORDER — SODIUM CHLORIDE 0.9 % IV SOLN
INTRAVENOUS | Status: DC
Start: 2021-10-21 — End: 2021-10-22

## 2021-10-21 MED ORDER — ONDANSETRON HCL 4 MG/2ML IJ SOLN: 4.0000 mg | Freq: Four times a day (QID) | INTRAMUSCULAR | Status: DC | PRN

## 2021-10-21 MED ORDER — ASPIRIN 81 MG PO TBEC
81.0000 mg | DELAYED_RELEASE_TABLET | Freq: Every day | ORAL | Status: DC
  Administered 2021-10-21: 81 mg via ORAL
  Filled 2021-10-21: qty 1

## 2021-10-21 NOTE — Assessment & Plan Note (Addendum)
Continue home Eliquis. 

## 2021-10-21 NOTE — Progress Notes (Signed)
Eeg done 
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
## 2021-10-21 NOTE — Assessment & Plan Note (Signed)
-   We will continue to Cymbalta and BuSpar as well as Abilify.

## 2021-10-21 NOTE — Assessment & Plan Note (Addendum)
Patient had another episode of transient word finding difficulties which resolved spontaneously within 10 to 15 minutes. Completing CVA/TIA work-up Per neurology partial seizure can be in differential due to history of prior traumatic brain injury. -Pending EEG -Pending echocardiogram -Continue to monitor -Continue with Lipitor -No antiplatelet as patient is on Eliquis

## 2021-10-21 NOTE — Hospital Course (Addendum)
Taken from H&P.  Ruth Klein is a 71 y.o. Caucasian female with medical history significant for atrial fibrillation on Eliquis, bipolar disorder, depression, PTSD, and history of breast cancer, presented to the emergency room with a counselor of expressive aphasia with difficulty finding words that started earlier today with no dysphagia or paresthesias or focal muscle weakness.  She denies any chest pain or palpitations.  No tinnitus or vertigo.  No urinary or stool incontinence.  She denies any witnessed seizures.  She was diagnosed with COVID-19 about a week ago and had 5-day course of Paxlovid.  Her cough has resolved she has chronic rhinorrhea.  No dyspnea or wheezing GERD nausea or vomiting or diarrhea.  Her fever resolved.  She had 2 vaccines and 2 boosters for COVID-19.  The patient's symptoms last about 3 hours. ED Course: When she came to the ER, BP was 150/84 with otherwise normal vital signs.  Labs revealed calcium of 10.5 and unremarkable CMP.   EKG as reviewed by me : EKG showed normal sinus rhythm with a rate of 68 with right atrial enlargement. Imaging: Brain MRI without contrast revealed no acute intracranial normalities.  It showed sequelae of prior traumatic brain injury with multifocal areas of encephalomalacia involving the high anterior frontal lobes and right temporal lobe and possible superimposed 2 cm arachnoid cyst at the peripheral right temporal lobe with no significant regional mass effect.  The patient was given 4 baby aspirin and 1 mg of IV Ativan.  She will be admitted to a medical telemetry observation bed for further evaluation and management.  Lipid panel with normal total cholesterol and triglycerides, LDL of 117. A1c of 5.7.  Patient had another episode of transient word finding difficulties while working with PT which resolved in 10 to 15 minutes. Neurology evaluated her, current differentiated TIA versus partial seizure as she has an history of traumatic brain  injury, EEG was ordered by neurology.  Carotid ultrasound pending.  Patient has an history of contrast allergy so CTA was not done. PT with no specific recommendations.  Patient appears to be at baseline now. Echocardiogram pending.

## 2021-10-21 NOTE — TOC Initial Note (Signed)
Transition of Care [facility]) - Initial/Assessment Note    Patient Details  Name: Ruth Klein MRN: [id number] Date of Birth: 1950/06/11  Transition of Care [facility]) CM/SW Contact:    Pete Pelt, RN Phone Number: 10/21/2021, 2:32 PM  Clinical Narrative:    Patient from [facility] independent living.  Patient is COVID + and has been self-isolating.  Plans to return to independent living when medically stable.               Expected Discharge Plan:  (TBD) Barriers to Discharge: Continued Medical Work up   Patient Goals and CMS Choice        Expected Discharge Plan and Services Expected Discharge Plan:  (TBD)   Discharge Planning Services: CM Consult   Living arrangements for the past 2 months: [place]                                      Prior Living Arrangements/Services Living arrangements for the past 2 months: [place] Lives with:: Self Patient language and need for interpreter reviewed:: Yes Do you feel safe going back to the place where you live?: Yes      Need for Family Participation in Patient Care: Yes (Comment) Care giver support system in place?: Yes (comment)   Criminal Activity/Legal Involvement Pertinent to Current Situation/Hospitalization: No - Comment as needed  Activities of Daily Living Home Assistive Devices/Equipment: None ADL Screening (condition at time of admission) Patient's cognitive ability adequate to safely complete daily activities?: Yes Is the patient deaf or have difficulty hearing?: No Does the patient have difficulty seeing, even when wearing glasses/contacts?: Yes Does the patient have difficulty concentrating, remembering, or making decisions?: No Patient able to express need for assistance with ADLs?: Yes Does the patient have difficulty dressing or bathing?: No Independently performs ADLs?: Yes (appropriate for developmental age) Does the patient have difficulty walking or climbing  stairs?: No Weakness of Legs: None Weakness of Arms/Hands: None  Permission Sought/Granted Permission sought to share information with : Case Manager, Customer service manager Permission granted to share information with : Yes, Verbal Permission Granted     Permission granted to share info w AGENCY: [place]        Emotional Assessment Appearance:: Appears stated age Attitude/Demeanor/Rapport: Gracious, Engaged Affect (typically observed): Pleasant, Appropriate   Alcohol / Substance Use: Not Applicable Psych Involvement: No (comment)  Admission diagnosis:  TIA (transient ischemic attack) [G45.9] Confusion [R41.0] Patient Active Problem List   Diagnosis Date Noted   TIA (transient ischemic attack) 10/21/2021   Paroxysmal atrial fibrillation ([place]) 10/21/2021   Anxiety and depression 10/21/2021   Other forms of angina pectoris ([place]) 07/02/2019   Anxiety 04/02/2019   Mixed hyperlipidemia 04/02/2019   Osteopenia 04/02/2019   Chronic idiopathic constipation 04/02/2019   Vaginal atrophy 04/02/2019   A-fib ([place]) 03/16/2019   Atrial fibrillation with RVR ([place]) 03/15/2019   PCP:  Maryland Pink, MD Pharmacy:   [place], [place] - [address] [place] [address] Phone: [phone] Fax: [phone]     Social Determinants of Health (SDOH) Interventions    Readmission Risk Interventions     View : No data to display.

## 2021-10-21 NOTE — Progress Notes (Signed)
Progress Note   Patient: Ruth Klein IRJ:[id number] DOB: 11-21-1950 DOA: 10/20/2021     0 DOS: the patient was seen and examined on 10/21/2021   Brief hospital course: Taken from H&P.  Ruth Klein is a 71 y.o. Caucasian female with medical history significant for atrial fibrillation on Eliquis, bipolar disorder, depression, PTSD, and history of breast cancer, presented to the emergency room with a counselor of expressive aphasia with difficulty finding words that started earlier today with no dysphagia or paresthesias or focal muscle weakness.  She denies any chest pain or palpitations.  No tinnitus or vertigo.  No urinary or stool incontinence.  She denies any witnessed seizures.  She was diagnosed with COVID-19 about a week ago and had 5-day course of Paxlovid.  Her cough has resolved she has chronic rhinorrhea.  No dyspnea or wheezing GERD nausea or vomiting or diarrhea.  Her fever resolved.  She had 2 vaccines and 2 boosters for COVID-19.  The patient's symptoms last about 3 hours. ED Course: When she came to the ER, BP was 150/84 with otherwise normal vital signs.  Labs revealed calcium of 10.5 and unremarkable CMP.   EKG as reviewed by me : EKG showed normal sinus rhythm with a rate of 68 with right atrial enlargement. Imaging: Brain MRI without contrast revealed no acute intracranial normalities.  It showed sequelae of prior traumatic brain injury with multifocal areas of encephalomalacia involving the high anterior frontal lobes and right temporal lobe and possible superimposed 2 cm arachnoid cyst at the peripheral right temporal lobe with no significant regional mass effect.  The patient was given 4 baby aspirin and 1 mg of IV Ativan.  She will be admitted to a medical telemetry observation bed for further evaluation and management.  Lipid panel with normal total cholesterol and triglycerides, LDL of 117. A1c of 5.7.  Patient had another episode of transient word finding difficulties while  working with PT which resolved in 10 to 15 minutes. Neurology evaluated her, current differentiated TIA versus partial seizure as she has an history of traumatic brain injury, EEG was ordered by neurology.  Carotid ultrasound pending.  Patient has an history of contrast allergy so CTA was not done. PT with no specific recommendations.  Patient appears to be at baseline now. Echocardiogram pending.   Assessment and Plan: * TIA (transient ischemic attack) Patient had another episode of transient word finding difficulties which resolved spontaneously within 10 to 15 minutes. Completing CVA/TIA work-up Per neurology partial seizure can be in differential due to history of prior traumatic brain injury. -Pending EEG -Pending echocardiogram -Continue to monitor -Continue with Lipitor -No antiplatelet as patient is on Eliquis  Paroxysmal atrial fibrillation (HCC) - Continue home Eliquis  Anxiety and depression - We will continue to Cymbalta and BuSpar as well as Abilify.     Subjective: Patient was seen and examined today.  She appears to be at her baseline and denies any new deficit except mentioned above.  Physical Exam: Vitals:   10/21/21 0347 10/21/21 0747 10/21/21 1036 10/21/21 1222  BP: 129/68 124/70 128/78 134/68  Pulse: 71 73 90 86  Resp: '14 20  19  '$ Temp: 98.1 F (36.7 C) (!) 97.4 F (36.3 C)  98 F (36.7 C)  TempSrc:      SpO2: 97% 96% 98% 96%  Weight:      Height:       General.  Well-developed elderly lady, in no acute distress. Pulmonary.  Lungs clear bilaterally, normal respiratory effort.  CV.  Regular rate and rhythm, no JVD, rub or murmur. Abdomen.  Soft, nontender, nondistended, BS positive. CNS.  Alert and oriented .  No focal neurologic deficit. Extremities.  No edema, no cyanosis, pulses intact and symmetrical. Psychiatry.  Judgment and insight appears normal.  Data Reviewed: Prior notes, labs and images reviewed  Family Communication:    Disposition: Status is: Observation The patient remains OBS appropriate and will d/c before 2 midnights.  Planned Discharge Destination: Home  Prophylaxis.  Eliquis Time spent: 45 minutes  This record has been created using Systems analyst. Errors have been sought and corrected,but may not always be located. Such creation errors do not reflect on the standard of care.  Author: Lorella Nimrod, MD 10/21/2021 3:49 PM  For on call review [email or web address].

## 2021-10-21 NOTE — Procedures (Signed)
Patient Name: Ruth Klein  MRN: [id number]  Epilepsy Attending: Lora Havens  Referring Physician/Provider: Kerney Elbe, MD Date: 10/21/2021 Duration: 35.11 mins  Patient history: 71 year old female with a history of atrial fibrillation, presenting with transient expressive dysphasia. EEG to evaluate for seizure.  Level of alertness: Awake, asleep  AEDs during EEG study: None  Technical aspects: This EEG study was done with scalp electrodes positioned according to the 10-20 International system of electrode placement. Electrical activity was acquired at a sampling rate of '500Hz'$  and reviewed with a high frequency filter of '70Hz'$  and a low frequency filter of '1Hz'$ . EEG data were recorded continuously and digitally stored.   Description: The posterior dominant rhythm consists of 9 Hz activity of moderate voltage (25-35 uV) seen predominantly in posterior head regions, symmetric and reactive to eye opening and eye closing. Sleep was characterized by vertex waves, sleep spindles (12 to 14 Hz), maximal frontocentral region. Physiologic photic driving was not seen during photic stimulation.  Hyperventilation was not performed.     IMPRESSION: This study is within normal limits. No seizures or epileptiform discharges were seen throughout the recording.  [name] [name]

## 2021-10-21 NOTE — Progress Notes (Addendum)
SLP Cancellation Note  Patient Details Name: Ruth Klein MRN: [id number] DOB: [birth year]   Cancelled treatment:       Reason Eval/Treat Not Completed:  (chart reviewed; consulted NSG/OT re: pt's status this morning. Will f/u in the PM post MD assessment d/t changes in status this morning per report.) Met briefly w/ pt who was verbally conversive in general conversation. Attended to need at the moment. Will f/u this PM.  Addendum(6/1) - f/u: pt continues to be verbally communicative and again engaged in general conversation appropriately w/ this Clinician. Did Not notice any overt word-finding deficits or incidences. Per MRI, pt has "prior traumatic brain injury with multifocal areas of encephalomalacia involving the high anterior frontal lobes and right temporal lobe. Also noted was a possible superimposed 2 cm arachnoid cyst at the peripheral right temporal lobe. No acute stroke seen.".  Noted Neurology f/u consult late morning indicating pt had "mild speech hesitancy at times that does not fall outside of the normal range. No dysfluency, naming difficulty or comprehension deficit noted.".  Discussed her symptoms/presentation and encouraged pt to f/u w/ her PCP for referral for Outpt ST services if any further concern or change from her normal baseline noted during ADLs post D/C home. Pt agreed.     Orinda Kenner, MS, CCC-SLP Speech Language Pathologist Rehab Services; [place] [phone] (ascom) Neo Yepiz 10/21/2021, 10:24 AM

## 2021-10-21 NOTE — Evaluation (Signed)
Occupational Therapy Evaluation Patient Details Name: Ruth Klein MRN: [id number] DOB: Mar 02, 1951 Today's Date: 10/21/2021   History of Present Illness Pt is a 71 year old female admitted with expressive aphasia with difficulty finding words that started earlier today with no dysphagia or paresthesias or focal muscle weakness, diagnosed with TIA. PMH significant for atrial fibrillation on Eliquis, bipolar disorder, depression, PTSD, and history of breast cancer. Brain MRI without contrast revealed no acute intracranial normalities.  It showed sequelae of prior traumatic brain injury with multifocal areas of encephalomalacia involving the high anterior frontal lobes and right temporal lobe and possible superimposed 2 cm arachnoid cyst at the peripheral right temporal lobe with no significant regional mass effect.   Clinical Impression   Chart reviewed, RN cleared pt for participation in OT evaluation. PTA pt is indep in all ADL/IADL, lives at [place] with husband. Pt was receiving outpatient PT for knee pain per report. At start of eval pt is alert and oriented x4, presenting with appropriate, fluid speech for conversation providing biographical information. Pt requires supervision-MOD I for all ADL/mobility. Pt reports feeling woozy following BM in bathroom, returned to chair with BP 128/78,HR 78. Pt then appears to present with increased word finding difficulties, RN notified and in room to assess. Pt reports "I feel like I cant get the words out" and is unable to provide basic information to this therapist that she previously provided at start. RN present to assess. MD notified. OT will follow acutely to address functional deficits, no OT follow up recommended. Pt is left in bed, in care of NT, NAD.      Recommendations for follow up therapy are one component of a multi-disciplinary discharge planning process, led by the attending physician.  Recommendations may be updated based on patient  status, additional functional criteria and insurance authorization.   Follow Up Recommendations  No OT follow up    Assistance Recommended at Discharge Intermittent Supervision/Assistance  Patient can return home with the following Assistance with cooking/housework    Functional Status Assessment  Patient has had a recent decline in their functional status and demonstrates the ability to make significant improvements in function in a reasonable and predictable amount of time.  Equipment Recommendations  None recommended by OT    Recommendations for Other Services       Precautions / Restrictions Precautions Precautions: None Restrictions Weight Bearing Restrictions: No      Mobility Bed Mobility Overal bed mobility: Independent                  Transfers Overall transfer level: Needs assistance   Transfers: Sit to/from Stand Sit to Stand: Independent                  Balance Overall balance assessment: No apparent balance deficits (not formally assessed)                                         ADL either performed or assessed with clinical judgement   ADL Overall ADL's : Needs assistance/impaired Eating/Feeding: Set up;Sitting   Grooming: Wash/dry hands;Wash/dry face;Standing;Supervision/safety               Lower Body Dressing: Set up;Sit to/from stand   Toilet Transfer: Systems analyst;Ambulation   Toileting- Clothing Manipulation and Hygiene: Modified independent;Sit to/from stand Toileting - Clothing Manipulation Details (indicate cue type and reason): increased  time     Functional mobility during ADLs: Supervision/safety;Independent (indep with no AD however pt did require close sup and frequent vcs when new onset of symptoms occured for return to bed)       Vision Patient Visual Report: No change from baseline       Perception     Praxis      Pertinent Vitals/Pain Pain Assessment Pain  Assessment: 0-10 Pain Score: 1  Pain Location: between 0-1 feeling "stiff" Pain Intervention(s): Monitored during session     Hand Dominance Right   Extremity/Trunk Assessment Upper Extremity Assessment Upper Extremity Assessment: Overall WFL for tasks assessed (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 mildly affecting grip strength/FMC)   Lower Extremity Assessment Lower Extremity Assessment: Overall WFL for tasks assessed;Defer to PT evaluation   Cervical / Trunk Assessment Cervical / Trunk Assessment: Normal   Communication Communication Communication: No difficulties   Cognition Arousal/Alertness: Awake/alert Behavior During Therapy: Flat affect Overall Cognitive Status: Impaired/Different from baseline                                 General Comments: Pt WNL at start of session, following mobility and ADL completion in bathroom, pt reports feeling woozy and then presents with word finding difficulties, RN in room to assess     General Comments       Exercises     Shoulder Instructions      Home Living Family/patient expects to be discharged to:: Private residence Living Arrangements: Spouse/significant other (ILF at [facility]) Available Help at Discharge: Family;Available 24 hours/day Type of Home: Independent living facility Home Access: Level entry     Home Layout: Two level;Able to live on main level with bedroom/bathroom Alternate Level Stairs-Number of Steps: 12 B rails, can reach both; loft/living area upstairs   Bathroom Shower/Tub: Tub/shower unit   Bathroom Toilet: Handicapped height Bathroom Accessibility: Yes   Home Equipment: Grab bars - tub/shower;Grab bars - toilet          Prior Functioning/Environment Prior Level of Function : Independent/Modified Independent             Mobility Comments: indep no AD, [facility] ADLs Comments: pt drives, cooks,cleans; has access to assist if needed but  typically does not require assist        OT Problem List: Decreased knowledge of use of DME or AE;Decreased activity tolerance      OT Treatment/Interventions: Self-care/ADL training;DME and/or AE instruction;Cognitive remediation/compensation;Therapeutic activities;Therapeutic exercise;Patient/family education    OT Goals(Current goals can be found in the care plan section) Acute Rehab OT Goals Patient Stated Goal: return to PLOF OT Goal Formulation: With patient Time For Goal Achievement: 11/04/21 Potential to Achieve Goals: Good ADL Goals Pt Will Transfer to Toilet: Independently Pt Will Perform Toileting - Clothing Manipulation and hygiene: Independently  OT Frequency: Min 3X/week    Co-evaluation              AM-PAC OT "6 Clicks" Daily Activity     Outcome Measure Help from another person eating meals?: None Help from another person taking care of personal grooming?: None Help from another person toileting, which includes using toliet, bedpan, or urinal?: None Help from another person bathing (including washing, rinsing, drying)?: None Help from another person to put on and taking off regular upper body clothing?: None   6 Click Score: 20   End of  Session Equipment Utilized During Treatment: Gait belt Nurse Communication: Mobility status  Activity Tolerance: Patient tolerated treatment well Patient left: in bed;with call bell/phone within reach;with bed alarm set  OT Visit Diagnosis: Other symptoms and signs involving the nervous system (O13.086)                Time: 5784-6962 OT Time Calculation (min): 46 min Charges:  OT General Charges $OT Visit: 1 Visit OT Evaluation $OT Eval Moderate Complexity: 1 Mod  Shanon Payor, OTD OTR/L  10/21/21, 10:58 AM

## 2021-10-21 NOTE — Evaluation (Signed)
Physical Therapy Evaluation Patient Details Name: Ruth Klein MRN: [id number] DOB: [birth year] Today's Date: 10/21/2021  History of Present Illness  Ruth Klein is a 71 year old female admitted with expressive aphasia with difficulty finding words that started earlier today with no dysphagia or paresthesias or focal muscle weakness, diagnosed with TIA. PMH significant for atrial fibrillation on Eliquis, bipolar disorder, depression, PTSD, and history of breast cancer. Brain MRI without contrast revealed no acute intracranial normalities.  It showed sequelae of prior traumatic brain injury with multifocal areas of encephalomalacia involving the high anterior frontal lobes and right temporal lobe and possible superimposed 2 cm arachnoid cyst at the peripheral right temporal lobe with no significant regional mass effect.  Clinical Impression  Pt admitted with above diagnosis. Pt currently with functional limitations due to the deficits listed below (see "PT Problem List"). Upon entry, pt in bed, awake and agreeable to participate. The pt is alert, pleasant, interactive, and able to provide info regarding prior level of function, both in tolerance and independence. Moving generally well and near baseline, no unsteadiness, but does have fluctuating mild dizziness and feelings of gross fatigue while upright for >90sec. Pt continues to endorse mild word finding issues, but this does not preclude her ability to communicate her needs. Patient's performance this date reveals decreased ability, independence, and tolerance in performing all basic mobility required for performance of activities of daily living. Pt will benefit from skilled PT intervention to increase independence and safety with basic mobility in preparation for discharge to the venue listed below.     Orthostatic VS for the past 24 hrs (Last 3 readings):  BP- Lying Pulse- Lying BP- Sitting Pulse- Sitting BP- Standing at 0 minutes Pulse-  Standing at 0 minutes BP- Standing at 3 minutes Pulse- Standing at 3 minutes  10/21/21 1030 125/64 83 124/76 82 115/82 88 130/85 93          Recommendations for follow up therapy are one component of a multi-disciplinary discharge planning process, led by the attending physician.  Recommendations may be updated based on patient status, additional functional criteria and insurance authorization.  Follow Up Recommendations No PT follow up    Assistance Recommended at Discharge None  Patient can return home with the following  Help with stairs or ramp for entrance    Equipment Recommendations None recommended by PT  Recommendations for Other Services       Functional Status Assessment Patient has had a recent decline in their functional status and demonstrates the ability to make significant improvements in function in a reasonable and predictable amount of time.     Precautions / Restrictions Precautions Precautions: Fall Restrictions Weight Bearing Restrictions: No      Mobility  Bed Mobility Overal bed mobility: Modified Independent                  Transfers Overall transfer level: Needs assistance   Transfers: Sit to/from Stand Sit to Stand: Supervision           General transfer comment: cues for line/lead safety    Ambulation/Gait Ambulation/Gait assistance: Supervision Gait Distance (Feet): 400 Feet Assistive device: None Gait Pattern/deviations: WFL(Within Functional Limits) Gait velocity: 0.23ms     General Gait Details: feels tired after 2514f  Stairs            Wheelchair Mobility    Modified Rankin (Stroke Patients Only)       Balance  Pertinent Vitals/Pain Pain Assessment Pain Assessment: No/denies pain    Home Living Family/patient expects to be discharged to:: Private residence Living Arrangements: Spouse/significant other (ILF at [facility]) Available  Help at Discharge: Family;Available 24 hours/day Type of Home: Independent living facility Home Access: Level entry     Alternate Level Stairs-Number of Steps: 12 B rails, can reach both; loft/living area upstairs Home Layout: Two level;Able to live on main level with bedroom/bathroom Home Equipment: Grab bars - tub/shower;Grab bars - toilet      Prior Function Prior Level of Function : Independent/Modified Independent             Mobility Comments: indep no AD, in PT at [facility] for knee pain ADLs Comments: pt drives, cooks,cleans; has access to assist if needed but typically does not require assist     Hand Dominance   Dominant Hand: Right    Extremity/Trunk Assessment   Upper Extremity Assessment Upper Extremity Assessment: Overall WFL for tasks assessed (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 mildly affecting grip strength/FMC)    Lower Extremity Assessment Lower Extremity Assessment: Overall WFL for tasks assessed;Defer to PT evaluation    Cervical / Trunk Assessment Cervical / Trunk Assessment: Normal  Communication   Communication: No difficulties  Cognition Arousal/Alertness: Awake/alert Behavior During Therapy: Flat affect, WFL for tasks assessed/performed Overall Cognitive Status: Impaired/Different from baseline                                          General Comments      Exercises     Assessment/Plan    PT Assessment Patient needs continued PT services  PT Problem List Decreased strength;Decreased range of motion;Decreased activity tolerance;Decreased balance;Decreased mobility;Decreased coordination;Decreased knowledge of use of DME;Decreased safety awareness       PT Treatment Interventions DME instruction;Balance training;Gait training;Stair training;Functional mobility training;Therapeutic activities;Therapeutic exercise;Patient/family education    PT Goals (Current goals can be found in the Care Plan section)  Acute  Rehab PT Goals Patient Stated Goal: regain independence and activity tolerance. PT Goal Formulation: With patient Time For Goal Achievement: 11/04/21 Potential to Achieve Goals: Good    Frequency Min 2X/week     Co-evaluation               AM-PAC PT "6 Clicks" Mobility  Outcome Measure Help needed turning from your back to your side while in a flat bed without using bedrails?: None Help needed moving from lying on your back to sitting on the side of a flat bed without using bedrails?: None Help needed moving to and from a bed to a chair (including a wheelchair)?: None Help needed standing up from a chair using your arms (e.g., wheelchair or bedside chair)?: None Help needed to walk in hospital room?: A Little Help needed climbing 3-5 steps with a railing? : A Little 6 Click Score: 22    End of Session   Activity Tolerance: Patient tolerated treatment well;No increased pain Patient left: in chair;with call bell/phone within reach   PT Visit Diagnosis: Unsteadiness on feet (R26.81);Other abnormalities of gait and mobility (R26.89);Difficulty in walking, not elsewhere classified (R26.2);Muscle weakness (generalized) (M62.81)    Time: 1607-3710 PT Time Calculation (min) (ACUTE ONLY): 29 min   Charges:   PT Evaluation $PT Eval Moderate Complexity: 1 Mod PT Treatments $Therapeutic Exercise: 8-22 mins       11:47 AM, 10/21/21  Etta Grandchild, PT, DPT Physical Therapist - [facility]  [phone] ([place])    Marguerite Barba C 10/21/2021, 11:45 AM

## 2021-10-21 NOTE — Progress Notes (Signed)
Initial Nutrition Assessment  DOCUMENTATION CODES:   Not applicable  INTERVENTION:   -Liberalize diet to regular for widest variety of meal selections -MVI with minerals daily -Ensure Enlive po BID, each supplement provides 350 kcal and 20 grams of protein.   NUTRITION DIAGNOSIS:   Increased nutrient needs related to acute illness as evidenced by estimated needs.  GOAL:   Patient will meet greater than or equal to 90% of their needs  MONITOR:   PO intake, Supplement acceptance  REASON FOR ASSESSMENT:   Malnutrition Screening Tool    ASSESSMENT:   Pt with medical history significant for atrial fibrillation on Eliquis, bipolar disorder, depression, PTSD, and history of breast cancer, presented with complaints of expressive aphasia with difficulty finding words that started earlier today with no dysphagia or paresthesias or focal muscle weakness.  Pt admitted with TIA.   Reviewed I/O's: +81 ml x 24 hours   Attempted to examine pt x 2, however, pt working with MD and OT at times of visits.   Pt currently on a heart healthy diet. No meal completions documented to assess at this time.  Reviewed wt hx; pt has experienced a 3.5% wt loss over the past 4 months, which is not significant for time frame.   Pt with increased nutritional needs for acute illness and would benefit from addition of oral nutrition supplements.   Medications reviewed and include calcium carbonate and 0.9% sodium chloride infusion @ 75 ml/hr.   Labs reviewed.   Diet Order:   Diet Order             Diet Heart Room service appropriate? Yes; Fluid consistency: Thin  Diet effective now                   EDUCATION NEEDS:   Not appropriate for education at this time  Skin:  Skin Assessment: Reviewed RN Assessment  Last BM:  10/20/21  Height:   Ht Readings from Last 1 Encounters:  10/20/21 '5\' 4"'$  (1.626 m)    Weight:   Wt Readings from Last 1 Encounters:  10/20/21 61.3 kg    Ideal  Body Weight:  54.5 kg  BMI:  Body mass index is 23.2 kg/m.  Estimated Nutritional Needs:   Kcal:  1650-1850  Protein:  85-100 grams  Fluid:  > 1.6 L    Loistine Chance, RD, LDN, [place] Registered Dietitian II Certified Diabetes Care and Education Specialist Please refer to [facility] for RD and/or RD on-call/weekend/after hours pager
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
## 2021-10-22 ENCOUNTER — Observation Stay
Admit: 2021-10-22 | Discharge: 2021-10-22 | Disposition: A | Discharge status: Home / Self Care | Payer: Medicare Other | Attending: Internal Medicine | Admitting: Internal Medicine

## 2021-10-22 ENCOUNTER — Encounter: Payer: Self-pay | Admitting: Family Medicine

## 2021-10-22 DIAGNOSIS — G459 Transient cerebral ischemic attack, unspecified: Secondary | ICD-10-CM | POA: Diagnosis not present

## 2021-10-22 LAB — ECHOCARDIOGRAM COMPLETE
Area-P 1/2: 2.39 cm2
Calc EF: 52.9 %
Height: 64 in
S' Lateral: 3.4 cm
Single Plane A2C EF: 57.9 %
Single Plane A4C EF: 45.9 %
Weight: 2162.27 oz

## 2021-10-22 NOTE — Discharge Summary (Signed)
Physician Discharge Summary   Patient: Ruth Klein MRN: [id number] DOB: Jun 01, 1950  Admit date:     10/20/2021  Discharge date: 10/22/21  Discharge Physician: Lorella Nimrod   PCP: Maryland Pink, MD   Recommendations at discharge:  Follow-up with primary care provider within a week.  Discharge Diagnoses: Principal Problem:   TIA (transient ischemic attack) Active Problems:   Paroxysmal atrial fibrillation (HCC)   Anxiety and depression  Hospital Course: Taken from H&P.  Talar Fraley is a 71 y.o. Caucasian female with medical history significant for atrial fibrillation on Eliquis, bipolar disorder, depression, PTSD, and history of breast cancer, presented to the emergency room with a counselor of expressive aphasia with difficulty finding words that started earlier today with no dysphagia or paresthesias or focal muscle weakness.  She denies any chest pain or palpitations.  No tinnitus or vertigo.  No urinary or stool incontinence.  She denies any witnessed seizures.  She was diagnosed with COVID-19 about a week ago and had 5-day course of Paxlovid.  Her cough has resolved she has chronic rhinorrhea.  No dyspnea or wheezing GERD nausea or vomiting or diarrhea.  Her fever resolved.  She had 2 vaccines and 2 boosters for COVID-19.  The patient's symptoms last about 3 hours. ED Course: When she came to the ER, BP was 150/84 with otherwise normal vital signs.  Labs revealed calcium of 10.5 and unremarkable CMP.   EKG as reviewed by me : EKG showed normal sinus rhythm with a rate of 68 with right atrial enlargement. Imaging: Brain MRI without contrast revealed no acute intracranial normalities.  It showed sequelae of prior traumatic brain injury with multifocal areas of encephalomalacia involving the high anterior frontal lobes and right temporal lobe and possible superimposed 2 cm arachnoid cyst at the peripheral right temporal lobe with no significant regional mass effect.  The patient was given  4 baby aspirin and 1 mg of IV Ativan.  She will be admitted to a medical telemetry observation bed for further evaluation and management.  Lipid panel with normal total cholesterol and triglycerides, LDL of 117, goal should be less than 70. A1c of 5.7.  Patient had another episode of transient word finding difficulties while working with PT which resolved in 10 to 15 minutes. Neurology evaluated her, current differentiated TIA versus partial seizure as she has an history of traumatic brain injury, EEG was ordered by neurology.  Carotid ultrasound pending.  Patient has an history of contrast allergy so CTA was not done. PT with no specific recommendations.  Patient appears to be at baseline now. Echocardiogram done-pending results. EEG was normal. Carotid ultrasound was negative for any significant stenosis.  Patient will continue current medications and follow-up with her providers.  Assessment and Plan: * TIA (transient ischemic attack) Patient had another episode of transient word finding difficulties which resolved spontaneously within 10 to 15 minutes. Completing CVA/TIA work-up Per neurology partial seizure can be in differential due to history of prior traumatic brain injury. -Pending EEG -Pending echocardiogram -Continue to monitor -Continue with Lipitor -No antiplatelet as patient is on Eliquis  Paroxysmal atrial fibrillation (HCC) - Continue home Eliquis  Anxiety and depression - We will continue to Cymbalta and BuSpar as well as Abilify.     Consultants: Neurology Procedures performed: EEG Disposition: Home Diet recommendation:  Discharge Diet Orders (From admission, onward)     Start     Ordered   10/22/21 0000  Diet - low sodium heart healthy  10/22/21 1127           Cardiac diet DISCHARGE MEDICATION: Allergies as of 10/22/2021       Reactions   Povidone-iodine Rash        Medication List     STOP taking these medications    azelaic  acid 20 % cream Commonly known as: AZELEX   Paxlovid (300/100) 20 x 150 MG & 10 x '100MG'$  Tbpk Generic drug: nirmatrelvir & ritonavir       TAKE these medications    apixaban 5 MG Tabs tablet Commonly known as: ELIQUIS Take 5 mg by mouth 2 (two) times daily.   ARIPiprazole 2 MG tablet Commonly known as: ABILIFY Take by mouth.   busPIRone 30 MG tablet Commonly known as: BUSPAR Take 30 mg by mouth 2 (two) times daily.   CALCIUM 600 PO Take 1 tablet by mouth daily.   DULoxetine 60 MG capsule Commonly known as: CYMBALTA Take 60 mg by mouth daily. Takes with a 30 mg tablet What changed: Another medication with the same name was removed. Continue taking this medication, and follow the directions you see here.   estradiol 0.1 MG/GM vaginal cream Commonly known as: ESTRACE Place 1 g vaginally 2 (two) times a week.   metroNIDAZOLE 0.75 % cream Commonly known as: MetroCream Apply to face 1-2 times a day as needed   multivitamin tablet Take 1 tablet by mouth daily.   NON FORMULARY Take by mouth daily as needed. Magnesium powder called "Calm" powder.   ondansetron 4 MG tablet Commonly known as: ZOFRAN Take 4 mg by mouth every 8 (eight) hours as needed.   polyethylene glycol 17 g packet Commonly known as: MIRALAX / GLYCOLAX Take 17 g by mouth as needed.   QC TUMERIC COMPLEX PO Take by mouth in the morning, at noon, and at bedtime.   traZODone 50 MG tablet Commonly known as: DESYREL Take 50 mg by mouth at bedtime.   TYLENOL PO Take 500 mg by mouth as needed. Patient will confirm mg at next appointment.        Follow-up Information     Maryland Pink, MD. Schedule an appointment as soon as possible for a visit in 1 week(s).   Specialty: Family Medicine Contact information: [address] [place] [address] [phone]                Discharge Exam: Danley Danker Weights   10/20/21 2027  Weight: 61.3 kg   General.     In no acute  distress. Pulmonary.  Lungs clear bilaterally, normal respiratory effort. CV.  Regular rate and rhythm, no JVD, rub or murmur. Abdomen.  Soft, nontender, nondistended, BS positive. CNS.  Alert and oriented .  No focal neurologic deficit. Extremities.  No edema, no cyanosis, pulses intact and symmetrical. Psychiatry.  Judgment and insight appears normal.   Condition at discharge: stable  The results of significant diagnostics from this hospitalization (including imaging, microbiology, ancillary and laboratory) are listed below for reference.   Imaging Studies: MR BRAIN WO CONTRAST  Result Date: 10/21/2021 CLINICAL DATA:  Initial evaluation for acute aphasia. EXAM: MRI HEAD WITHOUT CONTRAST TECHNIQUE: Multiplanar, multiecho pulse sequences of the brain and surrounding structures were obtained without intravenous contrast. COMPARISON:  None Available. FINDINGS: Brain: Cerebral volume within normal limits. Focal encephalomalacia of and gliosis involving the peripheral right temporal lobe is seen. Few additional small foci of encephalomalacia and gliosis noted at the high anterior frontal lobes bilaterally (series  15, image 43). Findings most likely related to prior traumatic brain injury. Possible superimposed arachnoid cyst at the peripheral right temporal lobe measures approximately 2 cm (series 10, image 7). No significant regional mass effect. No evidence for acute or subacute ischemia. No other areas of chronic cortical infarction or other insult. No visible acute or chronic intracranial blood products. No other mass lesion, mass effect or midline shift. No hydrocephalus or extra-axial collection. Pituitary gland suprasellar region within normal limits. Vascular: Major intracranial vascular flow voids are maintained. Skull and upper cervical spine: Craniocervical junction within normal limits. Bone marrow signal intensity normal. No acute scalp soft tissue abnormality. Sinuses/Orbits: Prior bilateral  ocular lens replacement. Mild scattered mucosal thickening noted within the ethmoidal air cells and maxillary sinuses. No mastoid effusion. Other: None. IMPRESSION: 1. No acute intracranial abnormality. 2. Sequelae of prior traumatic brain injury with multifocal areas of encephalomalacia involving the high anterior frontal lobes and right temporal lobe. 3. Possible superimposed 2 cm arachnoid cyst at the peripheral right temporal lobe. No significant regional mass effect. Electronically Signed   By: Jeannine Boga M.D.   On: 10/21/2021 00:44   US Carotid Bilateral (at [facility] and [facility] only)  Result Date: 10/22/2021 CLINICAL DATA:  Aphasia EXAM: BILATERAL CAROTID DUPLEX ULTRASOUND TECHNIQUE: Pearline Cables scale imaging, color Doppler and duplex ultrasound were performed of bilateral carotid and vertebral arteries in the neck. COMPARISON:  None Available. FINDINGS: Criteria: Quantification of carotid stenosis is based on velocity parameters that correlate the residual internal carotid diameter with NASCET-based stenosis levels, using the diameter of the distal internal carotid lumen as the denominator for stenosis measurement. The following velocity measurements were obtained: RIGHT ICA: 77/21 cm/sec CCA: 64/33 cm/sec SYSTOLIC ICA/CCA RATIO:  1.0 ECA: 117 cm/sec LEFT ICA: 76/23 cm/sec CCA: 29/51 cm/sec SYSTOLIC ICA/CCA RATIO:  1.0 ECA: 110 cm/sec RIGHT CAROTID ARTERY: Minimal atherosclerotic plaque in the carotid bulb and proximal bifurcation. No stenosis by Doppler criteria. Normal low resistance waveforms in the internal carotid artery. RIGHT VERTEBRAL ARTERY:  Antegrade flow LEFT CAROTID ARTERY: No significant atherosclerotic plaque. No stenosis by Doppler criteria. Normal low resistance waveforms. LEFT VERTEBRAL ARTERY:  Antegrade flow IMPRESSION: 1. Minimal atherosclerotic plaque in the right carotid bulb and proximal bifurcation. No significant atherosclerotic plaque in the left carotid circulation. No findings of  stenosis by Doppler criteria bilaterally. 2. Bilateral vertebral arteries demonstrate normal antegrade flow. Electronically Signed   By: Albin Felling M.D.   On: 10/22/2021 08:17   EEG adult  Result Date: 10/21/2021 Lora Havens, MD     10/21/2021  6:17 PM Patient Name: Levenia Skalicky MRN: [id number] Epilepsy Attending: Lora Havens Referring Physician/Provider: Kerney Elbe, MD Date: 10/21/2021 Duration: 35.11 mins Patient history: 71 year old female with a history of atrial fibrillation, presenting with transient expressive dysphasia. EEG to evaluate for seizure. Level of alertness: Awake, asleep AEDs during EEG study: None Technical aspects: This EEG study was done with scalp electrodes positioned according to the 10-20 International system of electrode placement. Electrical activity was acquired at a sampling rate of '500Hz'$  and reviewed with a high frequency filter of '70Hz'$  and a low frequency filter of '1Hz'$ . EEG data were recorded continuously and digitally stored. Description: The posterior dominant rhythm consists of 9 Hz activity of moderate voltage (25-35 uV) seen predominantly in posterior head regions, symmetric and reactive to eye opening and eye closing. Sleep was characterized by vertex waves, sleep spindles (12 to 14 Hz), maximal frontocentral region. Physiologic photic driving was not  seen during photic stimulation.  Hyperventilation was not performed.   IMPRESSION: This study is within normal limits. No seizures or epileptiform discharges were seen throughout the recording. Lora Havens    Microbiology: Results for orders placed or performed during the hospital encounter of 10/20/21  Resp Panel by RT-PCR (Flu A&B, Covid) Anterior Nasal Swab     Status: Abnormal   Collection Time: 10/21/21  1:01 AM   Specimen: Anterior Nasal Swab  Result Value Ref Range Status   SARS Coronavirus 2 by RT PCR POSITIVE (A) NEGATIVE Final    Comment: (NOTE) SARS-CoV-2 target nucleic acids are  DETECTED.  The SARS-CoV-2 RNA is generally detectable in upper respiratory specimens during the acute phase of infection. Positive results are indicative of the presence of the identified virus, but do not rule out bacterial infection or co-infection with other pathogens not detected by the test. Clinical correlation with patient history and other diagnostic information is necessary to determine patient infection status. The expected result is Negative.  Fact Sheet for Patients: EntrepreneurPulse.com.au  Fact Sheet for Healthcare Providers: IncredibleEmployment.be  This test is not yet approved or cleared by the [place] FDA and  has been authorized for detection and/or diagnosis of SARS-CoV-2 by FDA under an Emergency Use Authorization (EUA).  This EUA will remain in effect (meaning this test can be used) for the duration of  the COVID-19 declaration under Section 564(b)(1) of the A ct, 21 U.S.C. section 360bbb-3(b)(1), unless the authorization is terminated or revoked sooner.     Influenza A by PCR NEGATIVE NEGATIVE Final   Influenza B by PCR NEGATIVE NEGATIVE Final    Comment: (NOTE) The Xpert Xpress SARS-CoV-2/FLU/RSV plus assay is intended as an aid in the diagnosis of influenza from Nasopharyngeal swab specimens and should not be used as a sole basis for treatment. Nasal washings and aspirates are unacceptable for Xpert Xpress SARS-CoV-2/FLU/RSV testing.  Fact Sheet for Patients: EntrepreneurPulse.com.au  Fact Sheet for Healthcare Providers: IncredibleEmployment.be  This test is not yet approved or cleared by the [place] FDA and has been authorized for detection and/or diagnosis of SARS-CoV-2 by FDA under an Emergency Use Authorization (EUA). This EUA will remain in effect (meaning this test can be used) for the duration of the COVID-19 declaration under Section 564(b)(1) of the Act, 21  U.S.C. section 360bbb-3(b)(1), unless the authorization is terminated or revoked.  Performed at [facility], [address] [place], [place] [address]     Labs: CBC: Recent Labs  Lab 10/20/21 2157 10/21/21 0444  WBC 10.5 6.9  NEUTROABS 8.5*  --   HGB 14.1 12.7  HCT 42.8 38.1  MCV 93.7 92.7  PLT 294 027   Basic Metabolic Panel: Recent Labs  Lab 10/20/21 2157 10/21/21 0444  NA 138 139  K 4.1 3.9  CL 102 105  CO2 29 28  GLUCOSE 162* 103*  BUN 22 22  CREATININE 0.72 0.53  CALCIUM 10.5* 9.6   Liver Function Tests: No results for input(s): AST, ALT, ALKPHOS, BILITOT, PROT, ALBUMIN in the last 168 hours. CBG: No results for input(s): GLUCAP in the last 168 hours.  Discharge time spent: greater than 30 minutes.  This record has been created using Systems analyst. Errors have been sought and corrected,but may not always be located. Such creation errors do not reflect on the standard of care.   Signed: Lorella Nimrod, MD [facility] 10/22/2021

## 2021-10-22 NOTE — Plan of Care (Signed)
  Problem: Education: Goal: Knowledge of General Education information will improve Description: Including pain rating scale, medication(s)/side effects and non-pharmacologic comfort measures Outcome: Progressing   Problem: Health Behavior/Discharge Planning: Goal: Ability to manage health-related needs will improve Outcome: Progressing   Problem: Clinical Measurements: Goal: Ability to maintain clinical measurements within normal limits will improve Outcome: Progressing Goal: Will remain free from infection Outcome: Progressing Goal: Diagnostic test results will improve Outcome: Progressing Goal: Respiratory complications will improve Outcome: Progressing Goal: Cardiovascular complication will be avoided Outcome: Progressing   Problem: Activity: Goal: Risk for activity intolerance will decrease Outcome: Progressing   Problem: Nutrition: Goal: Adequate nutrition will be maintained Outcome: Progressing   Problem: Coping: Goal: Level of anxiety will decrease Outcome: Progressing   Problem: Elimination: Goal: Will not experience complications related to bowel motility Outcome: Progressing Goal: Will not experience complications related to urinary retention Outcome: Progressing   Problem: Pain Managment: Goal: General experience of comfort will improve Outcome: Progressing   Problem: Safety: Goal: Ability to remain free from injury will improve Outcome: Progressing   Problem: Skin Integrity: Goal: Risk for impaired skin integrity will decrease Outcome: Progressing   Problem: Education: Goal: Knowledge of disease or condition will improve Outcome: Progressing Goal: Knowledge of secondary prevention will improve (SELECT ALL) Outcome: Progressing   Problem: Health Behavior/Discharge Planning: Goal: Ability to manage health-related needs will improve Outcome: Progressing   Problem: Self-Care: Goal: Ability to participate in self-care as condition permits will  improve Outcome: Progressing Goal: Ability to communicate needs accurately will improve Outcome: Progressing   Problem: Nutrition: Goal: Risk of aspiration will decrease Outcome: Progressing   Problem: Ischemic Stroke/TIA Tissue Perfusion: Goal: Complications of ischemic stroke/TIA will be minimized Outcome: Progressing

## 2021-10-22 NOTE — Progress Notes (Signed)
Physical Therapy Treatment Patient Details Name: Ruth Klein MRN: [id number] DOB: [birth year] Today's Date: 10/22/2021   History of Present Illness Ruth Klein is a 71 year old female admitted with expressive aphasia with difficulty finding words that started earlier today with no dysphagia or paresthesias or focal muscle weakness, diagnosed with TIA. PMH significant for atrial fibrillation on Eliquis, bipolar disorder, depression, PTSD, and history of breast cancer. Brain MRI without contrast revealed no acute intracranial normalities.  It showed sequelae of prior traumatic brain injury with multifocal areas of encephalomalacia involving the high anterior frontal lobes and right temporal lobe and possible superimposed 2 cm arachnoid cyst at the peripheral right temporal lobe with no significant regional mass effect.    PT Comments    Pt awake, seated in bed, reports to feel better. Pt says quite fatigued following last PT visit, but questions the role of prolonged up to chair after walking. Pt AMB similar distance today, appears more confident, not c/o fatigue while up. Pt performs bed mobility, transfers and donning of pants at modI level, author helps with line/lead safety during activity. Pt reportedly feels safe with DC today and ability to gradually improve progression of AMB.     Recommendations for follow up therapy are one component of a multi-disciplinary discharge planning process, led by the attending physician.  Recommendations may be updated based on patient status, additional functional criteria and insurance authorization.  Follow Up Recommendations  No PT follow up     Assistance Recommended at Discharge None  Patient can return home with the following Help with stairs or ramp for entrance   Equipment Recommendations  None recommended by PT    Recommendations for Other Services       Precautions / Restrictions Precautions Precautions: Fall Restrictions Weight  Bearing Restrictions: No     Mobility  Bed Mobility Overal bed mobility: Independent                  Transfers Overall transfer level: Independent                      Ambulation/Gait Ambulation/Gait assistance: Modified independent (Device/Increase time) Gait Distance (Feet): 360 Feet Assistive device: None Gait Pattern/deviations: WFL(Within Functional Limits)       General Gait Details: appears more confident in mobility, less c/o of fatigue with AMB than previous day   Stairs             Wheelchair Mobility    Modified Rankin (Stroke Patients Only)       Balance                                            Cognition Arousal/Alertness: Awake/alert Behavior During Therapy: WFL for tasks assessed/performed Overall Cognitive Status: Within Functional Limits for tasks assessed                                          Exercises      General Comments        Pertinent Vitals/Pain Pain Assessment Pain Assessment: No/denies pain    Home Living                          Prior Function  PT Goals (current goals can now be found in the care plan section) Acute Rehab PT Goals Patient Stated Goal: regain independence and activity tolerance. PT Goal Formulation: With patient Time For Goal Achievement: 11/04/21 Potential to Achieve Goals: Good Progress towards PT goals: Progressing toward goals    Frequency    Min 2X/week      PT Plan      Co-evaluation              AM-PAC PT "6 Clicks" Mobility   Outcome Measure  Help needed turning from your back to your side while in a flat bed without using bedrails?: None Help needed moving from lying on your back to sitting on the side of a flat bed without using bedrails?: None Help needed moving to and from a bed to a chair (including a wheelchair)?: None Help needed standing up from a chair using your arms (e.g.,  wheelchair or bedside chair)?: None Help needed to walk in hospital room?: A Little Help needed climbing 3-5 steps with a railing? : A Little 6 Click Score: 22    End of Session   Activity Tolerance: Patient tolerated treatment well;No increased pain Patient left: with call bell/phone within reach;in bed   PT Visit Diagnosis: Unsteadiness on feet (R26.81);Other abnormalities of gait and mobility (R26.89);Difficulty in walking, not elsewhere classified (R26.2);Muscle weakness (generalized) (M62.81)     Time: 1050-1106 PT Time Calculation (min) (ACUTE ONLY): 16 min  Charges:  $Therapeutic Exercise: 8-22 mins                    11:24 AM, 10/22/21 Etta Grandchild, PT, DPT Physical Therapist - [facility]  [phone] ([place])     Lodie Waheed C 10/22/2021, 11:22 AM

## 2021-10-22 NOTE — Progress Notes (Signed)
  Echocardiogram 2D Echocardiogram has been performed.  Darlina Sicilian M 10/22/2021, 8:23 AM

## 2021-12-08 DIAGNOSIS — I6523 Occlusion and stenosis of bilateral carotid arteries: Secondary | ICD-10-CM | POA: Insufficient documentation

## 2022-02-11 DIAGNOSIS — H35371 Puckering of macula, right eye: Secondary | ICD-10-CM | POA: Insufficient documentation

## 2022-02-21 ENCOUNTER — Ambulatory Visit: Payer: Medicare Other | Admitting: Dermatology

## 2022-02-21 DIAGNOSIS — L578 Other skin changes due to chronic exposure to nonionizing radiation: Secondary | ICD-10-CM | POA: Diagnosis not present

## 2022-02-21 DIAGNOSIS — L814 Other melanin hyperpigmentation: Secondary | ICD-10-CM

## 2022-02-21 DIAGNOSIS — D2361 Other benign neoplasm of skin of right upper limb, including shoulder: Secondary | ICD-10-CM

## 2022-02-21 DIAGNOSIS — Z1283 Encounter for screening for malignant neoplasm of skin: Secondary | ICD-10-CM | POA: Diagnosis not present

## 2022-02-21 DIAGNOSIS — L820 Inflamed seborrheic keratosis: Secondary | ICD-10-CM

## 2022-02-21 DIAGNOSIS — D2262 Melanocytic nevi of left upper limb, including shoulder: Secondary | ICD-10-CM | POA: Diagnosis not present

## 2022-02-21 DIAGNOSIS — L821 Other seborrheic keratosis: Secondary | ICD-10-CM

## 2022-02-21 DIAGNOSIS — D239 Other benign neoplasm of skin, unspecified: Secondary | ICD-10-CM

## 2022-02-21 DIAGNOSIS — D229 Melanocytic nevi, unspecified: Secondary | ICD-10-CM

## 2022-02-21 DIAGNOSIS — D1801 Hemangioma of skin and subcutaneous tissue: Secondary | ICD-10-CM

## 2022-02-21 NOTE — Patient Instructions (Addendum)
Cryotherapy Aftercare  Wash gently with soap and water everyday.   Apply Vaseline and Band-Aid daily until healed.   Melanoma ABCDEs  Melanoma is the most dangerous type of skin cancer, and is the leading cause of death from skin disease.  You are more likely to develop melanoma if you: Have light-colored skin, light-colored eyes, or red or blond hair Spend a lot of time in the sun Tan regularly, either outdoors or in a tanning bed Have had blistering sunburns, especially during childhood Have a close family member who has had a melanoma Have atypical moles or large birthmarks  Early detection of melanoma is key since treatment is typically straightforward and cure rates are extremely high if we catch it early.   The first sign of melanoma is often a change in a mole or a new dark spot.  The ABCDE system is a way of remembering the signs of melanoma.  A for asymmetry:  The two halves do not match. B for border:  The edges of the growth are irregular. C for color:  A mixture of colors are present instead of an even brown color. D for diameter:  Melanomas are usually (but not always) greater than 6mm - the size of a pencil eraser. E for evolution:  The spot keeps changing in size, shape, and color.  Please check your skin once per month between visits. You can use a small mirror in front and a large mirror behind you to keep an eye on the back side or your body.   If you see any new or changing lesions before your next follow-up, please call to schedule a visit.  Please continue daily skin protection including broad spectrum sunscreen SPF 30+ to sun-exposed areas, reapplying every 2 hours as needed when you're outdoors.     Due to recent changes in healthcare laws, you may see results of your pathology and/or laboratory studies on MyChart before the doctors have had a chance to review them. We understand that in some cases there may be results that are confusing or concerning to you.  Please understand that not all results are received at the same time and often the doctors may need to interpret multiple results in order to provide you with the best plan of care or course of treatment. Therefore, we ask that you please give us 2 business days to thoroughly review all your results before contacting the office for clarification. Should we see a critical lab result, you will be contacted sooner.   If You Need Anything After Your Visit  If you have any questions or concerns for your doctor, please call our main line at [phone] and press option 4 to reach your doctor's medical assistant. If no one answers, please leave a voicemail as directed and we will return your call as soon as possible. Messages left after 4 pm will be answered the following business day.   You may also send us a message via MyChart. We typically respond to MyChart messages within 1-2 business days.  For prescription refills, please ask your pharmacy to contact our office. Our fax number is [phone].  If you have an urgent issue when the clinic is closed that cannot wait until the next business day, you can page your doctor at the number below.    Please note that while we do our best to be available for urgent issues outside of office hours, we are not available 24/7.   If you have an urgent   issue and are unable to reach us, you may choose to seek medical care at your doctor's office, retail clinic, urgent care center, or emergency room.  If you have a medical emergency, please immediately call 911 or go to the emergency department.  Pager Numbers  - Dr. Kowalski: [phone]  - Dr. Moye: [phone]  - Dr. Stewart: [phone]  In the event of inclement weather, please call our main line at [phone] for an update on the status of any delays or closures.  Dermatology Medication Tips: Please keep the boxes that topical medications come in in order to help keep track of the  instructions about where and how to use these. Pharmacies typically print the medication instructions only on the boxes and not directly on the medication tubes.   If your medication is too expensive, please contact our office at [phone] option 4 or send us a message through MyChart.   We are unable to tell what your co-pay for medications will be in advance as this is different depending on your insurance coverage. However, we may be able to find a substitute medication at lower cost or fill out paperwork to get insurance to cover a needed medication.   If a prior authorization is required to get your medication covered by your insurance company, please allow us 1-2 business days to complete this process.  Drug prices often vary depending on where the prescription is filled and some pharmacies may offer cheaper prices.  The website www.goodrx.com contains coupons for medications through different pharmacies. The prices here do not account for what the cost may be with help from insurance (it may be cheaper with your insurance), but the website can give you the price if you did not use any insurance.  - You can print the associated coupon and take it with your prescription to the pharmacy.  - You may also stop by our office during regular business hours and pick up a GoodRx coupon card.  - If you need your prescription sent electronically to a different pharmacy, notify our office through [facility] MyChart or by phone at [phone] option 4.     Si Usted Necesita Algo Despus de Su Visita  Tambin puede enviarnos un mensaje a travs de MyChart. Por lo general respondemos a los mensajes de MyChart en el transcurso de 1 a 2 das hbiles.  Para renovar recetas, por favor pida a su farmacia que se ponga en contacto con nuestra oficina. Nuestro nmero de fax es el [phone].  Si tiene un asunto urgente cuando la clnica est cerrada y que no puede esperar hasta el siguiente da hbil,  puede llamar/localizar a su doctor(a) al nmero que aparece a continuacin.   Por favor, tenga en cuenta que aunque hacemos todo lo posible para estar disponibles para asuntos urgentes fuera del horario de oficina, no estamos disponibles las 24 horas del da, los 7 das de la semana.   Si tiene un problema urgente y no puede comunicarse con nosotros, puede optar por buscar atencin mdica  en el consultorio de su doctor(a), en una clnica privada, en un centro de atencin urgente o en una sala de emergencias.  Si tiene una emergencia mdica, por favor llame inmediatamente al 911 o vaya a la sala de emergencias.  Nmeros de bper  - Dr. Kowalski: [phone]  - Dra. Moye: [phone]  - Dra. Stewart: [phone]  En caso de inclemencias del tiempo, por favor llame a nuestra lnea principal al [phone] para una actualizacin   sobre el estado de cualquier retraso o cierre.  Consejos para la medicacin en dermatologa: Por favor, guarde las cajas en las que vienen los medicamentos de uso tpico para ayudarle a seguir las instrucciones sobre dnde y cmo usarlos. Las farmacias generalmente imprimen las instrucciones del medicamento slo en las cajas y no directamente en los tubos del medicamento.   Si su medicamento es muy caro, por favor, pngase en contacto con nuestra oficina llamando al [phone] y presione la opcin 4 o envenos un mensaje a travs de MyChart.   No podemos decirle cul ser su copago por los medicamentos por adelantado ya que esto es diferente dependiendo de la cobertura de su seguro. Sin embargo, es posible que podamos encontrar un medicamento sustituto a menor costo o llenar un formulario para que el seguro cubra el medicamento que se considera necesario.   Si se requiere una autorizacin previa para que su compaa de seguros cubra su medicamento, por favor permtanos de 1 a 2 das hbiles para completar este proceso.  Los precios de los medicamentos varan con  frecuencia dependiendo del lugar de dnde se surte la receta y alguna farmacias pueden ofrecer precios ms baratos.  El sitio web www.goodrx.com tiene cupones para medicamentos de diferentes farmacias. Los precios aqu no tienen en cuenta lo que podra costar con la ayuda del seguro (puede ser ms barato con su seguro), pero el sitio web puede darle el precio si no utiliz ningn seguro.  - Puede imprimir el cupn correspondiente y llevarlo con su receta a la farmacia.  - Tambin puede pasar por nuestra oficina durante el horario de atencin regular y recoger una tarjeta de cupones de GoodRx.  - Si necesita que su receta se enve electrnicamente a una farmacia diferente, informe a nuestra oficina a travs de MyChart de [facility] o por telfono llamando al [phone] y presione la opcin 4.  

## 2022-02-21 NOTE — Progress Notes (Signed)
Follow-Up Visit   Subjective  Ruth Klein is a 71 y.o. female who presents for the following: Annual Exam (The patient presents for Total-Body Skin Exam (TBSE) for skin cancer screening and mole check.  The patient has spots, moles and lesions to be evaluated, some may be new or changing and the patient has concerns that these could be cancer. No hx of skin cancer. ).   The following portions of the chart were reviewed this encounter and updated as appropriate:       Review of Systems:  No other skin or systemic complaints except as noted in HPI or Assessment and Plan.  Objective  Well appearing patient in no apparent distress; mood and affect are within normal limits.  A full examination was performed including scalp, head, eyes, ears, nose, lips, neck, chest, axillae, abdomen, back, buttocks, bilateral upper extremities, bilateral lower extremities, hands, feet, fingers, toes, fingernails, and toenails. All findings within normal limits unless otherwise noted below.  residual at R vertex scalp x 1, R upper lip x 1 (2) Keratotic tan papules  left lower shoulder 3.0 x 2.72m med dark brown macule  Right Upper Arm 1.056mdark blue/brown macule    Assessment & Plan  Inflamed seborrheic keratosis (2) residual at R vertex scalp x 1, R upper lip x 1  Vs HyAK, Symptomatic, irritating, patient would like treated.     Destruction of lesion - residual at R vertex scalp x 1, R upper lip x 1  Destruction method: cryotherapy   Informed consent: discussed and consent obtained   Lesion destroyed using liquid nitrogen: Yes   Region frozen until ice ball extended beyond lesion: Yes   Outcome: patient tolerated procedure well with no complications   Post-procedure details: wound care instructions given   Additional details:  Prior to procedure, discussed risks of blister formation, small wound, skin dyspigmentation, or rare scar following cryotherapy. Recommend Vaseline ointment  to treated areas while healing.   Nevus left lower shoulder  Benign-appearing. Stable compared to previous visit. Observation.  Call clinic for new or changing moles.  Recommend daily use of broad spectrum spf 30+ sunscreen to sun-exposed areas.    Blue nevus Right Upper Arm  Benign-appearing. Stable compared to previous visit. Observation.  Call clinic for new or changing moles.  Recommend daily use of broad spectrum spf 30+ sunscreen to sun-exposed areas.    Lentigines - Scattered tan macules - Due to sun exposure - Benign-appearing, observe - Recommend daily broad spectrum sunscreen SPF 30+ to sun-exposed areas, reapply every 2 hours as needed. - Call for any changes  Seborrheic Keratoses - Stuck-on, waxy, tan-brown papules and/or plaques  - Benign-appearing - Discussed benign etiology and prognosis. - Observe - Call for any changes  Melanocytic Nevi - Tan-brown and/or pink-flesh-colored symmetric macules and papules - Benign appearing on exam today - Observation - Call clinic for new or changing moles - Recommend daily use of broad spectrum spf 30+ sunscreen to sun-exposed areas.   Hemangiomas - Red papules - Discussed benign nature - Observe - Call for any changes  Actinic Damage - Chronic condition, secondary to cumulative UV/sun exposure - diffuse scaly erythematous macules with underlying dyspigmentation - Recommend daily broad spectrum sunscreen SPF 30+ to sun-exposed areas, reapply every 2 hours as needed.  - Staying in the shade or wearing long sleeves, sun glasses (UVA+UVB protection) and wide brim hats (4-inch brim around the entire circumference of the hat) are also recommended for sun protection.  -  Call for new or changing lesions.  Skin cancer screening performed today.  Acrochordons (Skin Tags) - Fleshy, skin-colored pedunculated papules - Benign appearing.  - Observe. - If desired, they can be removed with an in office procedure that is not  covered by insurance. - Please call the clinic if you notice any new or changing lesions.  Return in about 1 year (around 02/22/2023) for TBSE.  Graciella Belton, RMA, am acting as scribe for Brendolyn Patty, MD .  Documentation: I have reviewed the above documentation for accuracy and completeness, and I agree with the above.  Brendolyn Patty MD

## 2022-08-25 DIAGNOSIS — Z95818 Presence of other cardiac implants and grafts: Secondary | ICD-10-CM | POA: Insufficient documentation

## 2022-09-09 ENCOUNTER — Ambulatory Visit: Payer: Medicare Other | Admitting: Podiatry

## 2022-09-09 DIAGNOSIS — L600 Ingrowing nail: Secondary | ICD-10-CM
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
## 2022-09-16 ENCOUNTER — Other Ambulatory Visit: Payer: Self-pay | Admitting: Dermatology

## 2022-09-16 DIAGNOSIS — L304 Erythema intertrigo: Secondary | ICD-10-CM

## 2022-12-12 ENCOUNTER — Encounter: Payer: Self-pay | Admitting: Podiatry

## 2022-12-12 ENCOUNTER — Ambulatory Visit: Payer: Medicare Other | Admitting: Podiatry

## 2022-12-12 VITALS — BP 126/61

## 2022-12-12 DIAGNOSIS — L600 Ingrowing nail: Secondary | ICD-10-CM

## 2022-12-12 DIAGNOSIS — C50919 Malignant neoplasm of unspecified site of unspecified female breast: Secondary | ICD-10-CM | POA: Insufficient documentation

## 2022-12-12 NOTE — Progress Notes (Signed)
  Subjective:  Patient ID: Ruth Klein, female    DOB: [birth year],  MRN: [id number]  Ruth Klein presents to clinic today for: with chief concern of ingrown toenail(s) to medial border left hallux. Onset was a few weeks ago.  Pain is described as tender.  Previous treatment(s) include prior professional treatment provided by Dr. Nicholes Rough which resolved symptoms, but now that toenail has grown back, it's tender in the corner again. She and her family will be going  Chief Complaint  Patient presents with   Nail Problem    RFC,Referring Provider Jerl Mina, MD,LOV:05/24      PCP is Jerl Mina, MD.  Allergies  Allergen Reactions   Povidone-Iodine Rash    Review of Systems: Negative except as noted in the HPI.  Objective: No changes noted in today's physical examination. Vitals:   12/12/22 1055  BP: 126/61   Ruth Klein is a pleasant 72 y.o. female in NAD. AAO x 3.  Vascular Examination: Capillary refill time <3 seconds b/l LE. Palpable pedal pulses b/l LE. Digital hair present b/l. No pedal edema b/l. Skin temperature gradient WNL b/l. No varicosities b/l. Marland Kitchen  Dermatological Examination: Pedal skin with normal turgor, texture and tone b/l. No open wounds. No interdigital macerations b/l. Incurvated nailplate medial border left hallux.  Nail border hypertrophy absent. There is tenderness to palpation. Sign(s) of infection: no clinical signs of infection noted on examination today.  Neurological Examination: Protective sensation intact with 10 gram monofilament b/l LE. Vibratory sensation intact b/l LE.   Musculoskeletal Examination: Normal muscle strength 5/5 to all lower extremity muscle groups bilaterally. No pain, crepitus or joint limitation noted with ROM b/l LE. No gross bony pedal deformities b/l. Patient ambulates independently without assistive aids.  Assessment/Plan: 1. Ingrown toenail of left foot     -Consent given for treatment as described  below: -Examined patient. -Patient to continue soft, supportive shoe gear daily. -Patient instructed to perform one epsom salt/warm water soak to help alleviate soreness of nail border. -No invasive procedure(s) performed. Offending nail border debrided and curretaged medial border left hallux utilizing sterile nail nipper and currette. Border(s) cleansed with alcohol. Patient instructed to apply Neosporin with Pain Relief once daily for 7 days. Call office if there are any concerns. -Patient/POA to call should there be question/concern in the interim.   Return in about 3 months (around 03/14/2023).  Freddie Breech, DPM

## 2023-03-16 ENCOUNTER — Encounter: Payer: Self-pay | Admitting: Podiatry

## 2023-03-16 ENCOUNTER — Ambulatory Visit: Payer: Medicare Other | Admitting: Podiatry

## 2023-03-16 DIAGNOSIS — M79674 Pain in right toe(s): Secondary | ICD-10-CM | POA: Diagnosis not present

## 2023-03-16 DIAGNOSIS — B351 Tinea unguium: Secondary | ICD-10-CM

## 2023-03-16 DIAGNOSIS — M79675 Pain in left toe(s): Secondary | ICD-10-CM

## 2023-03-16 NOTE — Progress Notes (Signed)
Subjective:  Patient ID: Ruth Klein, female    DOB: 03-Mar-1951,  MRN: [id number]  72 y.o. female presents painful elongated mycotic toenails 1-5 bilaterally which are tender when wearing enclosed shoe gear. Pain is relieved with periodic professional debridement.  New problem(s): None   PCP is Jerl Mina, MD.  Allergies  Allergen Reactions   Povidone-Iodine Rash   Review of Systems: Negative except as noted in the HPI.   Objective:  Ruth Klein is a pleasant 72 y.o. female WD, WN in NAD. AAO x 3.  Vascular Examination: Vascular status intact b/l with palpable pedal pulses. CFT immediate b/l. Pedal hair present. No edema. No pain with calf compression b/l. Skin temperature gradient WNL b/l. No varicosities noted. No cyanosis or clubbing noted.  Neurological Examination: Sensation grossly intact b/l with 10 gram monofilament. Vibratory sensation intact b/l.  Dermatological Examination: Pedal skin with normal turgor, texture and tone b/l. No open wounds nor interdigital macerations noted. Toenails 1-5 b/l thick, discolored, elongated with subungual debris and pain on dorsal palpation. No hyperkeratotic lesions noted b/l.   Musculoskeletal Examination: Muscle strength 5/5 to b/l LE.  No pain, crepitus noted b/l. No gross pedal deformities. Patient ambulates independently without assistive aids.   Radiographs: None  Last A1c:       No data to display          Assessment:   1. Pain due to onychomycosis of toenails of both feet    Plan:  Patient was evaluated and treated. All patient's and/or POA's questions/concerns addressed on today's visit. Toenails 1-5 debrided in length and girth without incident. Continue soft, supportive shoe gear daily. Report any pedal injuries to medical professional. Call office if there are any questions/concerns. -Patient/POA to call should there be question/concern in the interim.  Return in about 3 months (around 06/16/2023).  Freddie Breech, DPM

## 2023-03-27 ENCOUNTER — Ambulatory Visit (INDEPENDENT_AMBULATORY_CARE_PROVIDER_SITE_OTHER): Payer: Medicare Other | Admitting: Dermatology

## 2023-03-27 DIAGNOSIS — W908XXA Exposure to other nonionizing radiation, initial encounter: Secondary | ICD-10-CM

## 2023-03-27 DIAGNOSIS — L578 Other skin changes due to chronic exposure to nonionizing radiation: Secondary | ICD-10-CM | POA: Diagnosis not present

## 2023-03-27 DIAGNOSIS — D229 Melanocytic nevi, unspecified: Secondary | ICD-10-CM

## 2023-03-27 DIAGNOSIS — D1801 Hemangioma of skin and subcutaneous tissue: Secondary | ICD-10-CM

## 2023-03-27 DIAGNOSIS — Z1283 Encounter for screening for malignant neoplasm of skin: Secondary | ICD-10-CM | POA: Diagnosis not present

## 2023-03-27 DIAGNOSIS — D2261 Melanocytic nevi of right upper limb, including shoulder: Secondary | ICD-10-CM

## 2023-03-27 DIAGNOSIS — L814 Other melanin hyperpigmentation: Secondary | ICD-10-CM | POA: Diagnosis not present

## 2023-03-27 DIAGNOSIS — D2262 Melanocytic nevi of left upper limb, including shoulder: Secondary | ICD-10-CM

## 2023-03-27 DIAGNOSIS — L821 Other seborrheic keratosis: Secondary | ICD-10-CM

## 2023-03-27 NOTE — Patient Instructions (Addendum)
Melanoma ABCDEs  Melanoma is the most dangerous type of skin cancer, and is the leading cause of death from skin disease.  You are more likely to develop melanoma if you: Have light-colored skin, light-colored eyes, or red or blond hair Spend a lot of time in the sun Tan regularly, either outdoors or in a tanning bed Have had blistering sunburns, especially during childhood Have a close family member who has had a melanoma Have atypical moles or large birthmarks  Early detection of melanoma is key since treatment is typically straightforward and cure rates are extremely high if we catch it early.   The first sign of melanoma is often a change in a mole or a new dark spot.  The ABCDE system is a way of remembering the signs of melanoma.  A for asymmetry:  The two halves do not match. B for border:  The edges of the growth are irregular. C for color:  A mixture of colors are present instead of an even brown color. D for diameter:  Melanomas are usually (but not always) greater than 6mm - the size of a pencil eraser. E for evolution:  The spot keeps changing in size, shape, and color.  Please check your skin once per month between visits. You can use a small mirror in front and a large mirror behind you to keep an eye on the back side or your body.   If you see any new or changing lesions before your next follow-up, please call to schedule a visit.  Please continue daily skin protection including broad spectrum sunscreen SPF 30+ to sun-exposed areas, reapplying every 2 hours as needed when you're outdoors.    Mole A mole is a colored (pigmented) growth on the skin. Moles are very common. They are usually harmless, but some moles can become cancerous over time. What are the causes? Moles are caused when pigmented skin cells grow together in clusters instead of spreading out in the skin as they normally do. The reason why the skin cells grow together in clusters is not known. What  increases the risk? You are more likely to develop a mole if: You have family members who have moles. You are fair skinned. You have red or blond hair. You are often outdoors and exposed to the sun. You received phototherapy when you were a newborn baby. You are female. What are the signs or symptoms? A mole may occur anywhere on your skin. A mole may be: [place] or another color. Although moles are most often brown, they can also be tan, black, red, pink, blue, skin-toned, or colorless. Flat or raised. Smooth or wrinkled. Round in shape. How is this diagnosed? A mole is diagnosed with a skin exam. If your health care provider thinks a mole may be cancerous, all or part of the mole will be removed for testing (biopsy). How is this treated? Most moles are noncancerous (benign) and do not require treatment. If a mole is found to be cancerous, it will be removed. You may also choose to have a mole removed if it is causing pain or if you do not like the way it looks. Follow these instructions at home: General instructions  Every month, look for new moles and check your existing moles for changes. This is important because a change in a mole can mean that the mole has become cancerous. ABCDE changes in a mole indicate that you should be evaluated by your health care provider. ABCDE stands for: Asymmetry.  This means the mole has an irregular shape. It is not round or oval. Border. This means the mole has an irregular or bumpy border. Color. This means the mole has multiple colors in it, including brown, black, blue, red, or tan. Note that it is normal for moles to get darker when a woman is pregnant or takes birth control pills. Diameter. This means the mole is more than 0.2 inches (6 mm) across. Evolving. This refers to any unusual changes or symptoms in the mole, such as pain, itching, stinging, sensitivity, or bleeding. If you have a large number of moles, see a skin doctor (dermatologist) at  least one time every year for a full-body skin check. Lifestyle  When you are outdoors, wear sunscreen with SPF 30 (sun protection factor 30) or higher. Use an adequate amount of sunscreen to cover exposed areas of skin. Put it on 30 minutes before you go out. Reapply it every 2 hours or anytime you come out of the water. When you are out in the sun, wear a broad-brimmed hat and clothing that covers your arms and legs. Wear wraparound sunglasses. Contact a health care provider if: The size, shape, borders, or color of your mole changes. Your mole, or the skin near the mole, becomes painful, sore, red, or swollen. Your mole: Develops more than one color. Itches or bleeds. Becomes scaly, sheds skin, or oozes fluid. Becomes flat or develops raised areas. Becomes hard or soft. You develop a new mole. Summary A mole is a colored (pigmented) growth on the skin. Moles are very common. They are usually harmless, but some moles can become cancerous over time. Every month, look for new moles and check your existing moles for changes. This is important because a change in a mole can mean that the mole has become cancerous. If you have a large number of moles, see a skin doctor (dermatologist) at least one time every year for a full-body skin check. When you are outdoors, wear sunscreen with SPF 30 (sun protection factor 30) or higher. Reapply it every 2 hours or anytime you come out of the water. Contact a health care provider if you notice changes in a mole or if you develop a new mole. This information is not intended to replace advice given to you by your health care provider. Make sure you discuss any questions you have with your health care provider. Document Revised: 01/28/2021 Document Reviewed: 01/28/2021 Elsevier Patient Education  2024 Elsevier Inc. Seborrheic Keratosis  What causes seborrheic keratoses? Seborrheic keratoses are harmless, common skin growths that first appear during adult  life.  As time goes by, more growths appear.  Some people may develop a large number of them.  Seborrheic keratoses appear on both covered and uncovered body parts.  They are not caused by sunlight.  The tendency to develop seborrheic keratoses can be inherited.  They vary in color from skin-colored to gray, brown, or even black.  They can be either smooth or have a rough, warty surface.   Seborrheic keratoses are superficial and look as if they were stuck on the skin.  Under the microscope this type of keratosis looks like layers upon layers of skin.  That is why at times the top layer may seem to fall off, but the rest of the growth remains and re-grows.    Treatment Seborrheic keratoses do not need to be treated, but can easily be removed in the office.  Seborrheic keratoses often cause symptoms when they rub  on clothing or jewelry.  Lesions can be in the way of shaving.  If they become inflamed, they can cause itching, soreness, or burning.  Removal of a seborrheic keratosis can be accomplished by freezing, burning, or surgery. If any spot bleeds, scabs, or grows rapidly, please return to have it checked, as these can be an indication of a skin cancer.   Due to recent changes in healthcare laws, you may see results of your pathology and/or laboratory studies on MyChart before the doctors have had a chance to review them. We understand that in some cases there may be results that are confusing or concerning to you. Please understand that not all results are received at the same time and often the doctors may need to interpret multiple results in order to provide you with the best plan of care or course of treatment. Therefore, we ask that you please give [place] 2 business days to thoroughly review all your results before contacting the office for clarification. Should we see a critical lab result, you will be contacted sooner.   If You Need Anything After Your Visit  If you have any questions or concerns  for your doctor, please call our main line at [phone] and press option 4 to reach your doctor's medical assistant. If no one answers, please leave a voicemail as directed and we will return your call as soon as possible. Messages left after 4 pm will be answered the following business day.   You may also send [place] a message via MyChart. We typically respond to MyChart messages within 1-2 business days.  For prescription refills, please ask your pharmacy to contact our office. Our fax number is [phone].  If you have an urgent issue when the clinic is closed that cannot wait until the next business day, you can page your doctor at the number below.    Please note that while we do our best to be available for urgent issues outside of office hours, we are not available 24/7.   If you have an urgent issue and are unable to reach [place], you may choose to seek medical care at your doctor's office, retail clinic, urgent care center, or emergency room.  If you have a medical emergency, please immediately call 911 or go to the emergency department.  Pager Numbers  - Dr. Gwen Pounds: [phone]  - Dr. Roseanne Reno: [phone]  - Dr. Katrinka Blazing: [phone]   In the event of inclement weather, please call our main line at [phone] for an update on the status of any delays or closures.  Dermatology Medication Tips: Please keep the boxes that topical medications come in in order to help keep track of the instructions about where and how to use these. Pharmacies typically print the medication instructions only on the boxes and not directly on the medication tubes.   If your medication is too expensive, please contact our office at [phone] option 4 or send [place] a message through MyChart.   We are unable to tell what your co-pay for medications will be in advance as this is different depending on your insurance coverage. However, we may be able to find a substitute medication at lower cost or fill  out paperwork to get insurance to cover a needed medication.   If a prior authorization is required to get your medication covered by your insurance company, please allow [place] 1-2 business days to complete this process.  Drug prices often vary depending on where the prescription is filled and  some pharmacies may offer cheaper prices.  The website www.goodrx.com contains coupons for medications through different pharmacies. The prices here do not account for what the cost may be with help from insurance (it may be cheaper with your insurance), but the website can give you the price if you did not use any insurance.  - You can print the associated coupon and take it with your prescription to the pharmacy.  - You may also stop by our office during regular business hours and pick up a GoodRx coupon card.  - If you need your prescription sent electronically to a different pharmacy, notify our office through [facility] or by phone at [phone] option 4.     Si Usted Necesita Algo Despus de Su Visita  Tambin puede enviarnos un mensaje a travs de Clinical cytogeneticist. Por lo general respondemos a los mensajes de MyChart en el transcurso de 1 a 2 das hbiles.  Para renovar recetas, por favor pida a su farmacia que se ponga en contacto con nuestra oficina. Annie Sable de fax es [place] [phone].  Si tiene un asunto urgente cuando la clnica est cerrada y que no puede esperar hasta el siguiente da hbil, puede llamar/localizar a su doctor(a) al nmero que aparece a continuacin.   Por favor, tenga en cuenta que aunque hacemos todo lo posible para estar disponibles para asuntos urgentes fuera del horario de [place], no estamos disponibles las 24 horas del da, los 7 [address]  [address] de la [place].   Si tiene un problema urgente y no puede comunicarse con nosotros, puede optar por buscar atencin mdica  en el consultorio de su doctor(a), en una clnica privada, en un centro de atencin urgente o en una sala de  emergencias.  Si tiene Engineer, drilling, por favor llame inmediatamente al 911 o vaya a la sala de emergencias.  Nmeros de bper  - Dr. Gwen Pounds: [phone]  - Dra. Roseanne Reno: [phone]  - Dr. Katrinka Blazing: [phone]   En caso de inclemencias del tiempo, por favor llame a Lacy Duverney principal al [phone] para una actualizacin sobre el [place] de cualquier retraso o cierre.  Consejos para la medicacin en dermatologa: Por favor, guarde las cajas en las que vienen los medicamentos de uso tpico para ayudarle a seguir las instrucciones sobre dnde y cmo usarlos. Las farmacias generalmente imprimen las instrucciones del medicamento slo en las cajas y no directamente en los tubos del [place].   Si su medicamento es muy caro, por favor, pngase en contacto con Rolm Gala llamando al [phone] y presione la opcin 4 o envenos un mensaje a travs de Clinical cytogeneticist.   No podemos decirle cul ser su copago por los medicamentos por adelantado ya que esto es diferente dependiendo de la cobertura de su seguro. Sin embargo, es posible que podamos encontrar un medicamento sustituto a Audiological scientist un formulario para que el seguro cubra el medicamento que se considera necesario.   Si se requiere una autorizacin previa para que su compaa de seguros [place] su medicamento, por favor permtanos de 1 a 2 das hbiles para completar [address]  Los precios de los medicamentos varan con frecuencia dependiendo del Environmental consultant de dnde se surte la receta y alguna farmacias pueden ofrecer precios ms baratos.  El sitio web www.goodrx.com tiene cupones para medicamentos de Health and safety inspector. Los precios aqu no tienen en cuenta lo que podra costar con la ayuda del seguro (puede ser ms barato con su seguro), pero el sitio web puede darle el precio  si no utiliz [employer].  - Puede imprimir el cupn correspondiente y llevarlo con su receta a la farmacia.  - Tambin puede pasar por  nuestra oficina durante el horario de atencin regular y Education officer, museum una tarjeta de cupones de GoodRx.  - Si necesita que su receta se enve electrnicamente a una farmacia diferente, informe a nuestra oficina a travs de MyChart de [place] o por telfono llamando al [phone] y presione la opcin 4.

## 2023-03-27 NOTE — Progress Notes (Signed)
   Follow-Up Visit   Subjective  Ruth Klein is a 72 y.o. female who presents for the following: Skin Cancer Screening and Full Body Skin Exam  The patient presents for Total-Body Skin Exam (TBSE) for skin cancer screening and mole check. The patient has spots, moles and lesions to be evaluated, some may be new or changing and the patient may have concern these could be cancer.  The following portions of the chart were reviewed this encounter and updated as appropriate: medications, allergies, medical history  Review of Systems:  No other skin or systemic complaints except as noted in HPI or Assessment and Plan.  Objective  Well appearing patient in no apparent distress; mood and affect are within normal limits.  A full examination was performed including scalp, head, eyes, ears, nose, lips, neck, chest, axillae, abdomen, back, buttocks, bilateral upper extremities, bilateral lower extremities, hands, feet, fingers, toes, fingernails, and toenails. All findings within normal limits unless otherwise noted below.   Relevant physical exam findings are noted in the Assessment and Plan.    Assessment & Plan   SKIN CANCER SCREENING PERFORMED TODAY.  ACTINIC DAMAGE - Chronic condition, secondary to cumulative UV/sun exposure - diffuse scaly erythematous macules with underlying dyspigmentation - Recommend daily broad spectrum sunscreen SPF 30+ to sun-exposed areas, reapply every 2 hours as needed.  - Staying in the shade or wearing long sleeves, sun glasses (UVA+UVB protection) and wide brim hats (4-inch brim around the entire circumference of the hat) are also recommended for sun protection.  - Call for new or changing lesions.  LENTIGINES, SEBORRHEIC KERATOSES, HEMANGIOMAS - Benign normal skin lesions - Benign-appearing - Call for any changes  SEBORRHEIC KERATOSIS - 1.5 x 1.0 cm waxy tan patch with darker edge on the L upper pretibia.  - Stuck-on, waxy, tan-brown papules  and/or plaques  - Benign-appearing - Discussed benign etiology and prognosis. - Observe - Call for any changes  MELANOCYTIC NEVI - Tan-brown and/or pink-flesh-colored symmetric macules and papules - left lower shoulder 3.0 x 2.64mm med dark brown macule, stable compared to previous visit. - Right Upper Arm 1.15mm dark blue/brown macule, stable compared to previous visit. - Benign appearing on exam today - Observation - Call clinic for new or changing moles - Recommend daily use of broad spectrum spf 30+ sunscreen to sun-exposed areas.    Return in about 1 year (around 03/26/2024) for TBSE.  Maylene Roes, CMA, am acting as scribe for Willeen Niece, MD .   Documentation: I have reviewed the above documentation for accuracy and completeness, and I agree with the above.  Willeen Niece, MD

## 2023-06-19 ENCOUNTER — Ambulatory Visit: Payer: Medicare Other | Admitting: Podiatry

## 2023-06-19 ENCOUNTER — Encounter: Payer: Self-pay | Admitting: Podiatry

## 2023-06-19 VITALS — Ht 64.0 in | Wt 135.1 lb

## 2023-06-19 DIAGNOSIS — B351 Tinea unguium: Secondary | ICD-10-CM

## 2023-06-19 DIAGNOSIS — M79674 Pain in right toe(s): Secondary | ICD-10-CM | POA: Diagnosis not present

## 2023-06-19 DIAGNOSIS — M79675 Pain in left toe(s): Secondary | ICD-10-CM

## 2023-06-19 NOTE — Progress Notes (Signed)
  Subjective:  Patient ID: Ruth Klein, female    DOB: [birth year],  MRN: [id number]  73 y.o. female presents to clinic with  painful mycotic toenails of both feet that are difficult to trim. Pain interferes with daily activities and wearing enclosed shoe gear comfortably.  Chief Complaint  Patient presents with   Nail Problem    Pt is here for RFC not a diabetic PCP is Dr Burnett Sheng and LOV was in September.   New problem(s): None   PCP is Jerl Mina, MD.  Allergies  Allergen Reactions   Povidone-Iodine Rash    Review of Systems: Negative except as noted in the HPI.   Objective:  Ruth Klein is a pleasant 73 y.o. female WD, WN in NAD. AAO x 3.  Vascular Examination: Vascular status intact b/l with palpable pedal pulses. CFT immediate b/l. No edema. No pain with calf compression b/l. Skin temperature gradient WNL b/l. No cyanosis or clubbing noted b/l LE.  Neurological Examination: Sensation grossly intact b/l with 10 gram monofilament.  Dermatological Examination: Pedal skin with normal turgor, texture and tone b/l. Toenails 1-5 b/l thick, discolored, elongated with subungual debris and pain on dorsal palpation. No corns, calluses nor porokeratotic lesions noted.  Musculoskeletal Examination: Muscle strength 5/5 to b/l LE. No pain, crepitus or joint limitation noted with ROM bilateral LE. No gross bony deformities bilaterally.  Radiographs: None  Last A1c:       No data to display           Assessment:   1. Pain due to onychomycosis of toenails of both feet    Plan:  -Consent given for treatment as described below: -Examined patient. -Mycotic toenails 1-5 bilaterally were debrided in length and girth with sterile nail nippers and dremel without incident. -Patient/POA to call should there be question/concern in the interim.  Return in about 3 months (around 09/17/2023).  Ruth Klein, DPM      [place] LOCATION: [address], [place] [address]                   Office [phone]   [facility] LOCATION: [address] [place], [place] [address] Office [phone]

## 2023-07-03 ENCOUNTER — Ambulatory Visit: Payer: Medicare Other

## 2023-07-03 DIAGNOSIS — Z719 Counseling, unspecified: Secondary | ICD-10-CM

## 2023-07-03 DIAGNOSIS — Z23 Encounter for immunization: Secondary | ICD-10-CM

## 2023-07-03 NOTE — Progress Notes (Signed)
 Patient seen to day for HIB vaccine.  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  The vaccine has been showing up on her chart to get and recommended by her son who is a physician.  Standing order used and Pedvax HIB given.  Tolerated well. VIS provided. NCIR updated and copy provided to patient.

## 2023-09-18 ENCOUNTER — Encounter: Payer: Self-pay | Admitting: Podiatry

## 2023-09-18 ENCOUNTER — Ambulatory Visit: Payer: Medicare Other | Admitting: Podiatry

## 2023-09-18 VITALS — Ht 64.0 in | Wt 135.1 lb

## 2023-09-18 DIAGNOSIS — B351 Tinea unguium: Secondary | ICD-10-CM | POA: Diagnosis not present

## 2023-09-18 DIAGNOSIS — M79674 Pain in right toe(s): Secondary | ICD-10-CM | POA: Diagnosis not present

## 2023-09-18 DIAGNOSIS — M79675 Pain in left toe(s): Secondary | ICD-10-CM

## 2023-09-23 ENCOUNTER — Encounter: Payer: Self-pay | Admitting: Podiatry

## 2023-09-23 NOTE — Progress Notes (Signed)
  Subjective:  Patient ID: Ruth Klein, female    DOB: Oct 20, 1950,  MRN: [id number]  73 y.o. female presents painful, elongated thickened toenails x 10 which are symptomatic when wearing enclosed shoe gear. This interferes with his/her daily activities. Chief Complaint  Patient presents with   Nail Problem    Pt is here for [facility] PCP is Dr Dean Every and LOV was in September.   \ New problem(s): None   PCP is Lyle San, MD.  Allergies  Allergen Reactions   Povidone-Iodine Rash    Review of Systems: Negative except as noted in the HPI.   Objective:  Ruth Klein is a pleasant 73 y.o. female WD, WN in NAD. AAO x 3.  Vascular Examination: Vascular status intact b/l with palpable pedal pulses. CFT immediate b/l. Pedal hair present. No edema. No pain with calf compression b/l. Skin temperature gradient WNL b/l. No varicosities noted. No cyanosis or clubbing noted.  Neurological Examination: Sensation grossly intact b/l with 10 gram monofilament. Vibratory sensation intact b/l.  Dermatological Examination: Pedal skin with normal turgor, texture and tone b/l. No open wounds nor interdigital macerations noted. Toenails 1-5 b/l thick, discolored, elongated with subungual debris and pain on dorsal palpation. No hyperkeratotic lesions noted b/l.   Musculoskeletal Examination: Muscle strength 5/5 to b/l LE.  No pain, crepitus noted b/l. No gross pedal deformities. Patient ambulates independently without assistive aids.   Radiographs: None  Last A1c:       No data to display           Assessment:   1. Pain due to onychomycosis of toenails of both feet    Plan:  Patient was evaluated and treated. All patient's and/or POA's questions/concerns addressed on today's visit. Toenails 1-5 debrided in length and girth without incident. Continue soft, supportive shoe gear daily. Report any pedal injuries to medical professional. Call office if there are any questions/concerns. -Patient/POA  to call should there be question/concern in the interim.  Return in about 3 months (around 12/18/2023).  Luella Sager, DPM      [place] LOCATION: [address], [place] [address]                   Office [phone]   [facility] LOCATION: [address] [place], [place] [address] Office [phone]

## 2023-12-18 ENCOUNTER — Encounter: Payer: Self-pay | Admitting: Podiatry

## 2023-12-18 ENCOUNTER — Ambulatory Visit: Admitting: Podiatry

## 2023-12-18 DIAGNOSIS — M79675 Pain in left toe(s): Secondary | ICD-10-CM

## 2023-12-18 DIAGNOSIS — L600 Ingrowing nail: Secondary | ICD-10-CM

## 2023-12-18 DIAGNOSIS — L02612 Cutaneous abscess of left foot: Secondary | ICD-10-CM

## 2023-12-18 DIAGNOSIS — B351 Tinea unguium: Secondary | ICD-10-CM

## 2023-12-18 DIAGNOSIS — L03032 Cellulitis of left toe: Secondary | ICD-10-CM

## 2023-12-18 DIAGNOSIS — M79674 Pain in right toe(s): Secondary | ICD-10-CM | POA: Diagnosis not present

## 2023-12-18 MED ORDER — GENTAMICIN SULFATE 0.1 % EX CREA: TOPICAL_CREAM | CUTANEOUS | 1 refills | Status: AC

## 2023-12-18 NOTE — Patient Instructions (Signed)
 EPSOM SALT FOOT SOAK INSTRUCTIONS  *IF YOU HAVE BEEN PRESCRIBED ANTIBIOTICS, TAKE AS INSTRUCTED UNTIL ALL ARE GONE*  Shopping List:  A. Plain epsom salt (not scented) B.  Gentamicin  cream C. 1-inch fabric band-aids   Place 1/4 cup of epsom salts in 2 quarts of warm tap water. IF YOU ARE DIABETIC, OR HAVE NEUROPATHY, CHECK THE TEMPERATURE OF THE WATER WITH YOUR ELBOW.   Submerge your foot/feet in the solution and soak for 10-15 minutes.      3.  Next, remove your foot/feet from solution, blot dry the affected area.    4.  Apply light amount of antibiotic cream/ointment and cover with fabric band-aid .  5.  This soak should be done once a day for 3 days.   6.  Monitor for any signs/symptoms of infection such as redness, swelling, odor, drainage, increased pain, or non-healing of digit.   7.  Please do not hesitate to call the office and speak to a Nurse or Doctor if you have questions.   8.  If you experience fever, chills, nightsweats, nausea or vomiting with worsening of digit/foot, please go to the emergency room.

## 2023-12-18 NOTE — Progress Notes (Unsigned)
  Subjective:  Patient ID: Ruth Klein, female    DOB: [birth year],  MRN: [id number]  73 y.o. female presents to clinic with  painful thick toenails that are difficult to trim. Pain interferes with ambulation. Aggravating factors include wearing enclosed shoe gear. Pain is relieved with periodic professional debridement.  Chief Complaint  Patient presents with   RFC    Rm3 Routine foot care/ not diabetic/ Dr Nonnie last visit April 2025   New problem(s): Patient c/o painful left great toe lateral distal corner. States it has been painful for about a couple of weeks. Denies any drainage or swelling.   PCP is Valora Agent, MD.  Allergies  Allergen Reactions   Povidone-Iodine Rash   Review of Systems: Negative except as noted in the HPI.   Objective:  Nitya Cauthon is a pleasant 73 y.o. female WD, WN in NAD. AAO x 3.  Vascular Examination: Vascular status intact b/l with palpable pedal pulses. Pedal hair sparse. CFT immediate b/l. No edema. No pain with calf compression b/l. Skin temperature gradient WNL b/l. No ischemia or gangrene noted b/l LE. No cyanosis or clubbing noted b/l LE.  Neurological Examination: Sensation grossly intact b/l with 10 gram monofilament. Vibratory sensation intact b/l.   Dermatological Examination: Incurvated nail  noted lateral border at distal tip of left hallux. Localized pustule at distal tip. Pinpoint abscess without penetration into deep tissues. There is tenderness to palpation. Pedal skin with normal turgor, texture and tone b/l.   Toenails 1-5 b/l thick, discolored, elongated with subungual debris and pain on dorsal palpation. No corns, calluses nor porokeratotic lesions noted.  Musculoskeletal Examination: Muscle strength 5/5 to b/l LE. No pain, crepitus or joint limitation noted with ROM bilateral LE. No gross bony deformities bilaterally.  Radiographs: None Assessment:   1. Pain due to onychomycosis of toenails of both feet   2. Ingrown  toenail of left foot   3. Cellulitis and abscess of toe of left foot    Plan:  Consent given for treatment. Patient examined. All patient's and/or POA's questions/concerns addressed on today's visit. Offending nail border left hallux gently debrided. Evacuated localized abscess with sterile scalpel blade. Digit irrigated with Dial antibacterial soap and alcohol. TAO applied. She is to perform epsom salt soaks once daily for the next 3 days. This was a pinpoint abscess. Patient does not want to take oral antibiotics, but will call or contact via MyChart if she has a problem. Rx sent to pharmacy for gentamicin  cream to apply to digit once daily. She is to call office if she has any problems. Mycotic toenails right great toe and 2-5 b/l debrided in length and girth without incident. Continue soft, supportive shoe gear daily. Report any pedal injuries to medical professional. Call office if there are any quesitons/concerns.  Return in about 3 months (around 03/19/2024).  Delon LITTIE Merlin, DPM      [place] LOCATION: [address], [place] [address]                   Office [phone]   [facility] LOCATION: [address] [place], [place] [address] Office [phone]

## 2024-01-18 ENCOUNTER — Other Ambulatory Visit: Payer: Self-pay | Admitting: Dermatology

## 2024-01-18 DIAGNOSIS — L304 Erythema intertrigo: Secondary | ICD-10-CM

## 2024-03-21 ENCOUNTER — Encounter: Payer: Self-pay | Admitting: Podiatry

## 2024-03-21 ENCOUNTER — Ambulatory Visit: Admitting: Podiatry

## 2024-03-21 DIAGNOSIS — M79675 Pain in left toe(s): Secondary | ICD-10-CM

## 2024-03-21 DIAGNOSIS — M79674 Pain in right toe(s): Secondary | ICD-10-CM

## 2024-03-21 DIAGNOSIS — B351 Tinea unguium: Secondary | ICD-10-CM | POA: Diagnosis not present

## 2024-03-21 NOTE — Progress Notes (Signed)
  Subjective:  Patient ID: Ruth Klein, female    DOB: 30-Sep-1950,  MRN: [id number]  73 y.o. female presents to clinic with  painful mycotic toenails of both feet that are difficult to trim. Pain interferes with daily activities and wearing enclosed shoe gear comfortably. Patient states she performed epsom salt soaks and applied antibiotic cream to left great toe. States it has resolved. She was unaware a prescription was sent to her pharmacy for antibiotic cream.  Chief Complaint  Patient presents with   Toe Pain    A1c 5.8 Last visit with Dr. Valora was in April.      New problem(s): None   PCP is Valora Lynwood FALCON, MD.  Allergies  Allergen Reactions   Povidone-Iodine Rash    Review of Systems: Negative except as noted in the HPI.   Objective:  Ruth Klein is a pleasant 73 y.o. female WD, WN in NAD. AAO x 3.  Vascular Examination: Vascular status intact b/l with palpable pedal pulses. Pedal hair sparse. CFT immediate b/l. No edema. No pain with calf compression b/l. Skin temperature gradient WNL b/l.   Neurological Examination: Sensation grossly intact b/l with 10 gram monofilament. Vibratory sensation intact b/l.   Dermatological Examination: Ingrown nail left great toe resolved.   Pedal skin with normal turgor, texture and tone b/l. Toenails 1-5 b/l thick, discolored, elongated with subungual debris and pain on dorsal palpation. No hyperkeratotic lesions noted b/l.   Musculoskeletal Examination: Muscle strength 5/5 to b/l LE. No pain, crepitus or joint limitation noted with ROM bilateral LE. No gross bony deformities bilaterally.  Radiographs: None   Assessment:   1. Pain due to onychomycosis of toenails of both feet    Plan:  Patient was evaluated and treated. All patient's and/or POA's questions/concerns addressed on today's visit. Toenails 1-5 b/l debrided in length and girth without incident. Continue soft, supportive shoe gear daily. Report any pedal injuries to  medical professional. Call office if there are any questions/concerns. Patient given information to [facility]. -Patient/POA to call should there be question/concern in the interim.  No follow-ups on file.  Delon LITTIE Merlin, DPM      [place] LOCATION: [address] [place] [address]                   Office [phone]   [facility] LOCATION: [address] [place], [place] [address] Office [phone]

## 2024-03-26 ENCOUNTER — Encounter: Payer: Self-pay | Admitting: Dermatology

## 2024-03-26 ENCOUNTER — Ambulatory Visit: Payer: Medicare Other | Admitting: Dermatology

## 2024-03-26 DIAGNOSIS — D1801 Hemangioma of skin and subcutaneous tissue: Secondary | ICD-10-CM

## 2024-03-26 DIAGNOSIS — D2261 Melanocytic nevi of right upper limb, including shoulder: Secondary | ICD-10-CM

## 2024-03-26 DIAGNOSIS — L304 Erythema intertrigo: Secondary | ICD-10-CM

## 2024-03-26 DIAGNOSIS — D229 Melanocytic nevi, unspecified: Secondary | ICD-10-CM

## 2024-03-26 DIAGNOSIS — R238 Other skin changes: Secondary | ICD-10-CM

## 2024-03-26 DIAGNOSIS — L719 Rosacea, unspecified: Secondary | ICD-10-CM

## 2024-03-26 DIAGNOSIS — L570 Actinic keratosis: Secondary | ICD-10-CM

## 2024-03-26 DIAGNOSIS — L578 Other skin changes due to chronic exposure to nonionizing radiation: Secondary | ICD-10-CM | POA: Diagnosis not present

## 2024-03-26 DIAGNOSIS — L821 Other seborrheic keratosis: Secondary | ICD-10-CM

## 2024-03-26 DIAGNOSIS — Z1283 Encounter for screening for malignant neoplasm of skin: Secondary | ICD-10-CM | POA: Diagnosis not present

## 2024-03-26 DIAGNOSIS — D2262 Melanocytic nevi of left upper limb, including shoulder: Secondary | ICD-10-CM

## 2024-03-26 DIAGNOSIS — W908XXA Exposure to other nonionizing radiation, initial encounter: Secondary | ICD-10-CM | POA: Diagnosis not present

## 2024-03-26 DIAGNOSIS — L814 Other melanin hyperpigmentation: Secondary | ICD-10-CM

## 2024-03-26 MED ORDER — METRONIDAZOLE 0.75 % EX CREA: TOPICAL_CREAM | CUTANEOUS | 3 refills | Status: AC

## 2024-03-26 NOTE — Patient Instructions (Addendum)
 Cryotherapy Aftercare  Wash gently with soap and water everyday.   Apply Vaseline Jelly daily until healed.    Continue Ketoconazole  2% cream once or twice daily to breast folds as needed for flares.  Call for refills.   Continue Metronidazole  0.75% cream once or twice daily to face as needed for rosacea.  Call for refills.   Neutrogena Stubborn Acne BPO samples apply once or twice daily to affected area on chest.  Benzoyl peroxide can cause dryness and irritation of the skin. It can also bleach fabric. When used together with Aczone (dapsone) cream, it can stain the skin orange.    Recommend daily broad spectrum sunscreen SPF 30+ to sun-exposed areas, reapply every 2 hours as needed. Call for new or changing lesions.  Staying in the shade or wearing long sleeves, sun glasses (UVA+UVB protection) and wide brim hats (4-inch brim around the entire circumference of the hat) are also recommended for sun protection.      Melanoma ABCDEs  Melanoma is the most dangerous type of skin cancer, and is the leading cause of death from skin disease.  You are more likely to develop melanoma if you: Have light-colored skin, light-colored eyes, or red or blond hair Spend a lot of time in the sun Tan regularly, either outdoors or in a tanning bed Have had blistering sunburns, especially during childhood Have a close family member who has had a melanoma Have atypical moles or large birthmarks  Early detection of melanoma is key since treatment is typically straightforward and cure rates are extremely high if we catch it early.   The first sign of melanoma is often a change in a mole or a new dark spot.  The ABCDE system is a way of remembering the signs of melanoma.  A for asymmetry:  The two halves do not match. B for border:  The edges of the growth are irregular. C for color:  A mixture of colors are present instead of an even brown color. D for diameter:  Melanomas are usually (but not  always) greater than 6mm - the size of a pencil eraser. E for evolution:  The spot keeps changing in size, shape, and color.  Please check your skin once per month between visits. You can use a small mirror in front and a large mirror behind you to keep an eye on the back side or your body.   If you see any new or changing lesions before your next follow-up, please call to schedule a visit.  Please continue daily skin protection including broad spectrum sunscreen SPF 30+ to sun-exposed areas, reapplying every 2 hours as needed when you're outdoors.   Staying in the shade or wearing long sleeves, sun glasses (UVA+UVB protection) and wide brim hats (4-inch brim around the entire circumference of the hat) are also recommended for sun protection.      Due to recent changes in healthcare laws, you may see results of your pathology and/or laboratory studies on MyChart before the doctors have had a chance to review them. We understand that in some cases there may be results that are confusing or concerning to you. Please understand that not all results are received at the same time and often the doctors may need to interpret multiple results in order to provide you with the best plan of care or course of treatment. Therefore, we ask that you please give us  2 business days to thoroughly review all your results before contacting the office for clarification.  Should we see a critical lab result, you will be contacted sooner.   If You Need Anything After Your Visit  If you have any questions or concerns for your doctor, please call our main line at [phone] and press option 4 to reach your doctor's medical assistant. If no one answers, please leave a voicemail as directed and we will return your call as soon as possible. Messages left after 4 pm will be answered the following business day.   You may also send us  a message via MyChart. We typically respond to MyChart messages within 1-2 business  days.  For prescription refills, please ask your pharmacy to contact our office. Our fax number is [phone].  If you have an urgent issue when the clinic is closed that cannot wait until the next business day, you can page your doctor at the number below.    Please note that while we do our best to be available for urgent issues outside of office hours, we are not available 24/7.   If you have an urgent issue and are unable to reach us , you may choose to seek medical care at your doctor's office, retail clinic, urgent care center, or emergency room.  If you have a medical emergency, please immediately call 911 or go to the emergency department.  Pager Numbers  - Dr. Hester: [phone]  - Dr. Jackquline: [phone]  - Dr. Claudene: [phone]   - Dr. Raymund: [phone]  In the event of inclement weather, please call our main line at [phone] for an update on the status of any delays or closures.  Dermatology Medication Tips: Please keep the boxes that topical medications come in in order to help keep track of the instructions about where and how to use these. Pharmacies typically print the medication instructions only on the boxes and not directly on the medication tubes.   If your medication is too expensive, please contact our office at [phone] option 4 or send us  a message through MyChart.   We are unable to tell what your co-pay for medications will be in advance as this is different depending on your insurance coverage. However, we may be able to find a substitute medication at lower cost or fill out paperwork to get insurance to cover a needed medication.   If a prior authorization is required to get your medication covered by your insurance company, please allow us  1-2 business days to complete this process.  Drug prices often vary depending on where the prescription is filled and some pharmacies may offer cheaper prices.  The website www.goodrx.com contains  coupons for medications through different pharmacies. The prices here do not account for what the cost may be with help from insurance (it may be cheaper with your insurance), but the website can give you the price if you did not use any insurance.  - You can print the associated coupon and take it with your prescription to the pharmacy.  - You may also stop by our office during regular business hours and pick up a GoodRx coupon card.  - If you need your prescription sent electronically to a different pharmacy, notify our office through [facility] or by phone at [phone] option 4.     Si Usted Necesita Algo Despus de Su Visita  Tambin puede enviarnos un mensaje a travs de Clinical Cytogeneticist. Por lo general respondemos a los mensajes de MyChart en el transcurso de 1 a 2 das hbiles.  Para renovar recetas, por favor pida  a su farmacia que se ponga en contacto con nuestra oficina. Randi lakes de fax es [place] [phone].  Si tiene un asunto urgente cuando la clnica est cerrada y que no puede esperar hasta el siguiente da hbil, puede llamar/localizar a su doctor(a) al nmero que aparece a continuacin.   Por favor, tenga en cuenta que aunque hacemos todo lo posible para estar disponibles para asuntos urgentes fuera del horario de [place], no estamos disponibles las 24 horas del da, los 7 [address]  [address] de la [place].   Si tiene un problema urgente y no puede comunicarse con nosotros, puede optar por buscar atencin mdica  en el consultorio de su doctor(a), en una clnica privada, en un centro de atencin urgente o en una sala de emergencias.  Si tiene engineer, drilling, por favor llame inmediatamente al 911 o vaya a la sala de emergencias.  Nmeros de bper  - Dr. Hester: [phone]  - Dra. Jackquline: [phone]  - Dr. Claudene: [phone]  - Dra. Kitts: [phone]  En caso de inclemencias del [place], por favor llame a nuestra lnea principal al [phone] para una  actualizacin sobre el estado de cualquier retraso o cierre.  Consejos para la medicacin en dermatologa: Por favor, guarde las cajas en las que vienen los medicamentos de uso tpico para ayudarle a seguir las instrucciones sobre dnde y cmo usarlos. Las farmacias generalmente imprimen las instrucciones del medicamento slo en las cajas y no directamente en los tubos del [place].   Si su medicamento es muy caro, por favor, pngase en contacto con landry rieger llamando al [phone] y presione la opcin 4 o envenos un mensaje a travs de Clinical Cytogeneticist.   No podemos decirle cul ser su copago por los medicamentos por adelantado ya que esto es diferente dependiendo de la cobertura de su seguro. Sin embargo, es posible que podamos encontrar un medicamento sustituto a audiological scientist un formulario para que el seguro cubra el medicamento que se considera necesario.   Si se requiere una autorizacin previa para que su compaa de seguros cubra su medicamento, por favor permtanos de 1 a 2 das hbiles para completar este proceso.  Los precios de los medicamentos varan con frecuencia dependiendo del environmental consultant de dnde se surte la receta y alguna farmacias pueden ofrecer precios ms baratos.  El sitio web www.goodrx.com tiene cupones para medicamentos de health and safety inspector. Los precios aqu no tienen en cuenta lo que podra costar con la ayuda del seguro (puede ser ms barato con su seguro), pero el sitio web puede darle el precio si no utiliz tourist information centre manager.  - Puede imprimir el cupn correspondiente y llevarlo con su receta a la farmacia.  - Tambin puede pasar por nuestra oficina durante el horario de atencin regular y education officer, museum una tarjeta de cupones de GoodRx.  - Si necesita que su receta se enve electrnicamente a una farmacia diferente, informe a nuestra oficina a travs de MyChart de [place] o por telfono llamando al [phone] y presione la opcin 4.

## 2024-03-26 NOTE — Progress Notes (Signed)
 Follow-Up Visit   Subjective  Ruth Klein is a 73 y.o. female who presents for the following: Skin Cancer Screening and Full Body Skin Exam.   Red spot on chest. Non tender, denies itching. Dur: 1 week.   The patient presents for Total-Body Skin Exam (TBSE) for skin cancer screening and mole check. The patient has spots, moles and lesions to be evaluated, some may be new or changing and the patient may have concern these could be cancer.    The following portions of the chart were reviewed this encounter and updated as appropriate: medications, allergies, medical history  Review of Systems:  No other skin or systemic complaints except as noted in HPI or Assessment and Plan.  Objective  Well appearing patient in no apparent distress; mood and affect are within normal limits.  A full examination was performed including scalp, head, eyes, ears, nose, lips, neck, chest, axillae, abdomen, back, buttocks, bilateral upper extremities, bilateral lower extremities, hands, feet, fingers, toes, fingernails, and toenails. All findings within normal limits unless otherwise noted below.   Relevant physical exam findings are noted in the Assessment and Plan.  Vertex scalp x1 Erythematous thin papules/macules with gritty scale.   Assessment & Plan   SKIN CANCER SCREENING PERFORMED TODAY.  ACTINIC DAMAGE - Chronic condition, secondary to cumulative UV/sun exposure - diffuse scaly erythematous macules with underlying dyspigmentation - Recommend daily broad spectrum sunscreen SPF 30+ to sun-exposed areas, reapply every 2 hours as needed.  - Staying in the shade or wearing long sleeves, sun glasses (UVA+UVB protection) and wide brim hats (4-inch brim around the entire circumference of the hat) are also recommended for sun protection.  - Call for new or changing lesions.  LENTIGINES, SEBORRHEIC KERATOSES, HEMANGIOMAS - Benign normal skin lesions - Benign-appearing - Call for any  changes  MELANOCYTIC NEVI - Tan-brown and/or pink-flesh-colored symmetric macules and papules - left lower shoulder 3 x 2 mm med dark brown macule, stable compared to previous visit. - Right Upper Arm 1 mm dark blue/brown macule, stable compared to previous visit. (Blue nevus) - Benign appearing on exam today - Observation - Call clinic for new or changing moles - Recommend daily use of broad spectrum spf 30+ sunscreen to sun-exposed areas.    SEBORRHEIC KERATOSIS - 1.5 x 1.0 cm waxy tan patch with darker edge on the L upper pretibia.  Benign-appearing. Stable compared to previous visit. Observation.  Call clinic for new or changing moles.  Recommend daily use of broad spectrum spf 30+ sunscreen to sun-exposed areas.    Inflammatory Papule Exam: erythematous papule at mid sternum  Treatment:  Neutrogena Stubborn Acne BPO samples given to be applied once or twice daily to affected area.   Recheck 4-6 weeks if not resolved.    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 Currently well-controlled.   Intertrigo is a chronic recurrent rash that occurs in skin fold areas that may be associated with friction; heat; moisture; yeast; fungus; and bacteria.  It is exacerbated by increased movement / activity; sweating; and higher atmospheric temperature.  Use of an absorbant powder such as Zeasorb AF powder or other OTC antifungal powder to the area daily can prevent rash recurrence. Other options to help keep the area dry include blow drying the area after bathing or using antiperspirant products such as Duradry sweat minimizing gel.  Treatment Plan: Continue Ketoconazole  2% cream once or twice daily to breast folds as  needed for flares.  Call for refills.   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 Currently well-controlled.     Rosacea is a chronic progressive skin condition usually affecting the face of adults, causing redness and/or acne bumps. It is treatable but not curable. It sometimes affects the eyes (ocular rosacea) as well. It may respond to topical and/or systemic medication and can flare with stress, sun exposure, alcohol, exercise, topical steroids (including hydrocortisone/cortisone 10) and some foods.  Daily application of broad spectrum spf 30+ sunscreen to face is recommended to reduce flares.  Patient denies grittiness of the eyes  Treatment Plan Continue Metronidazole  0.75% cream. Apply once or twice daily to face for rosacea.     AK (ACTINIC KERATOSIS) Vertex scalp x1 Actinic keratoses are precancerous spots that appear secondary to cumulative UV radiation exposure/sun exposure over time. They are chronic with expected duration over 1 year. A portion of actinic keratoses will progress to squamous cell carcinoma of the skin. It is not possible to reliably predict which spots will progress to skin cancer and so treatment is recommended to prevent development of skin cancer.  Recommend daily broad spectrum sunscreen SPF 30+ to sun-exposed areas, reapply every 2 hours as needed.  Recommend staying in the shade or wearing long sleeves, sun glasses (UVA+UVB protection) and wide brim hats (4-inch brim around the entire circumference of the hat). Call for new or changing lesions. Destruction of lesion - Vertex scalp x1  Destruction method: cryotherapy   Informed consent: discussed and consent obtained   Lesion destroyed using liquid nitrogen: Yes   Region frozen until ice ball extended beyond lesion: Yes   Outcome: patient tolerated procedure well with no complications   Post-procedure details: wound care instructions given   Additional details:  Prior to procedure, discussed risks of blister formation, small wound, skin dyspigmentation, or rare scar following cryotherapy. Recommend Vaseline  ointment to treated areas while healing.   ROSACEA   Related Medications metroNIDAZOLE  (METROCREAM ) 0.75 % cream Apply to face 1-2 times a day as needed  Return in about 1 year (around 03/26/2025) for TBSE.  I, Ruth Klein, CMA, am acting as scribe for Rexene Rattler, MD.   Documentation: I have reviewed the above documentation for accuracy and completeness, and I agree with the above.  Rexene Rattler, MD

## 2024-03-28 ENCOUNTER — Other Ambulatory Visit: Payer: Self-pay | Admitting: Sports Medicine

## 2024-03-28 DIAGNOSIS — M76891 Other specified enthesopathies of right lower limb, excluding foot: Secondary | ICD-10-CM

## 2024-03-28 DIAGNOSIS — G8929 Other chronic pain: Secondary | ICD-10-CM

## 2024-03-28 DIAGNOSIS — M25551 Pain in right hip: Secondary | ICD-10-CM

## 2024-03-28 DIAGNOSIS — M1611 Unilateral primary osteoarthritis, right hip: Secondary | ICD-10-CM

## 2024-03-28 DIAGNOSIS — M7061 Trochanteric bursitis, right hip: Secondary | ICD-10-CM

## 2024-04-05 ENCOUNTER — Ambulatory Visit: Admission: RE | Admit: 2024-04-05 | Source: Ambulatory Visit

## 2024-04-17 ENCOUNTER — Ambulatory Visit
Admission: RE | Admit: 2024-04-17 | Discharge: 2024-04-17 | Disposition: A | Discharge status: Home / Self Care | Source: Ambulatory Visit | Attending: Sports Medicine | Admitting: Sports Medicine

## 2024-04-17 DIAGNOSIS — G8929 Other chronic pain: Secondary | ICD-10-CM | POA: Diagnosis present

## 2024-04-17 DIAGNOSIS — M1611 Unilateral primary osteoarthritis, right hip: Secondary | ICD-10-CM | POA: Insufficient documentation

## 2024-04-17 DIAGNOSIS — M5441 Lumbago with sciatica, right side: Secondary | ICD-10-CM | POA: Diagnosis present

## 2024-04-17 DIAGNOSIS — M7061 Trochanteric bursitis, right hip: Secondary | ICD-10-CM | POA: Diagnosis present

## 2024-04-17 DIAGNOSIS — M76891 Other specified enthesopathies of right lower limb, excluding foot: Secondary | ICD-10-CM | POA: Diagnosis present

## 2024-04-17 DIAGNOSIS — M25551 Pain in right hip: Secondary | ICD-10-CM | POA: Diagnosis present

## 2024-07-01 ENCOUNTER — Ambulatory Visit: Admitting: Podiatry

## 2025-04-14 ENCOUNTER — Encounter: Admitting: Dermatology

## 2025-04-15 ENCOUNTER — Encounter: Admitting: Dermatology
